# Patient Record
Sex: Female | Born: 1980 | Race: White | Hispanic: No | Marital: Married | Smoking: Never smoker
Health system: Southern US, Community
[De-identification: ages and names within clinical notes are randomized; demographics above are authoritative.]

## PROBLEM LIST (undated history)

## (undated) DIAGNOSIS — K8020 Calculus of gallbladder without cholecystitis without obstruction: Secondary | ICD-10-CM

## (undated) DIAGNOSIS — Z8489 Family history of other specified conditions: Secondary | ICD-10-CM

## (undated) DIAGNOSIS — T8859XA Other complications of anesthesia, initial encounter: Secondary | ICD-10-CM

## (undated) DIAGNOSIS — Z8619 Personal history of other infectious and parasitic diseases: Secondary | ICD-10-CM

## (undated) DIAGNOSIS — T4145XA Adverse effect of unspecified anesthetic, initial encounter: Secondary | ICD-10-CM

## (undated) DIAGNOSIS — G610 Guillain-Barre syndrome: Secondary | ICD-10-CM

## (undated) HISTORY — PX: WISDOM TOOTH EXTRACTION: SHX21

---
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
## 2000-02-21 ENCOUNTER — Encounter: Payer: Self-pay | Admitting: Emergency Medicine

## 2000-02-21 ENCOUNTER — Emergency Department (HOSPITAL_COMMUNITY): Admission: EM | Admit: 2000-02-21 | Discharge: 2000-02-21 | Payer: Self-pay | Admitting: Emergency Medicine

## 2000-05-06 ENCOUNTER — Other Ambulatory Visit: Admission: RE | Admit: 2000-05-06 | Discharge: 2000-05-06 | Payer: Self-pay | Admitting: *Deleted

## 2001-07-23 ENCOUNTER — Other Ambulatory Visit: Admission: RE | Admit: 2001-07-23 | Discharge: 2001-07-23 | Payer: Self-pay | Admitting: Obstetrics and Gynecology

## 2005-07-10 ENCOUNTER — Other Ambulatory Visit: Admission: RE | Admit: 2005-07-10 | Discharge: 2005-07-10 | Payer: Self-pay | Admitting: Gynecology

## 2006-02-13 ENCOUNTER — Other Ambulatory Visit: Admission: RE | Admit: 2006-02-13 | Discharge: 2006-02-13 | Payer: Self-pay | Admitting: Gynecology

## 2006-07-15 ENCOUNTER — Other Ambulatory Visit: Admission: RE | Admit: 2006-07-15 | Discharge: 2006-07-15 | Payer: Self-pay | Admitting: Gynecology

## 2006-10-17 ENCOUNTER — Other Ambulatory Visit: Admission: RE | Admit: 2006-10-17 | Discharge: 2006-10-17 | Payer: Self-pay | Admitting: Gynecology

## 2007-05-16 ENCOUNTER — Emergency Department (HOSPITAL_COMMUNITY): Admission: EM | Admit: 2007-05-16 | Discharge: 2007-05-16 | Payer: Self-pay | Admitting: Emergency Medicine

## 2007-06-04 ENCOUNTER — Other Ambulatory Visit: Admission: RE | Admit: 2007-06-04 | Discharge: 2007-06-04 | Payer: Self-pay | Admitting: Gynecology

## 2007-12-09 ENCOUNTER — Other Ambulatory Visit: Admission: RE | Admit: 2007-12-09 | Discharge: 2007-12-09 | Payer: Self-pay | Admitting: Gynecology

## 2008-07-29 ENCOUNTER — Other Ambulatory Visit: Admission: RE | Admit: 2008-07-29 | Discharge: 2008-07-29 | Payer: Self-pay | Admitting: Gynecology

## 2011-04-25 LAB — URINALYSIS, ROUTINE W REFLEX MICROSCOPIC
Bilirubin Urine: NEGATIVE
Glucose, UA: NEGATIVE
Ketones, ur: NEGATIVE
Nitrite: NEGATIVE
Protein, ur: NEGATIVE
Specific Gravity, Urine: 1.015
Urobilinogen, UA: 0.2
pH: 6

## 2011-04-25 LAB — I-STAT 8, (EC8 V) (CONVERTED LAB)
Chloride: 108
Glucose, Bld: 119 — ABNORMAL HIGH
Potassium: 3.9
pH, Ven: 7.47 — ABNORMAL HIGH

## 2011-04-25 LAB — POCT I-STAT CREATININE
Creatinine, Ser: 0.9
Operator id: 282201

## 2011-04-25 LAB — URINE MICROSCOPIC-ADD ON

## 2011-04-25 LAB — ETHANOL: Alcohol, Ethyl (B): 244 — ABNORMAL HIGH

## 2011-04-25 LAB — PREGNANCY, URINE: Preg Test, Ur: NEGATIVE

## 2017-07-16 DIAGNOSIS — K8020 Calculus of gallbladder without cholecystitis without obstruction: Secondary | ICD-10-CM

## 2017-07-16 HISTORY — DX: Calculus of gallbladder without cholecystitis without obstruction: K80.20

## 2017-07-20 ENCOUNTER — Emergency Department (HOSPITAL_COMMUNITY): Payer: 59

## 2017-07-20 ENCOUNTER — Emergency Department (HOSPITAL_COMMUNITY)
Admission: EM | Admit: 2017-07-20 | Discharge: 2017-07-20 | Disposition: A | Discharge status: Home / Self Care | Payer: 59 | Attending: Emergency Medicine | Admitting: Emergency Medicine

## 2017-07-20 ENCOUNTER — Encounter (HOSPITAL_COMMUNITY): Payer: Self-pay | Admitting: *Deleted

## 2017-07-20 ENCOUNTER — Other Ambulatory Visit: Payer: Self-pay

## 2017-07-20 DIAGNOSIS — Z79899 Other long term (current) drug therapy: Secondary | ICD-10-CM | POA: Diagnosis not present

## 2017-07-20 DIAGNOSIS — K8020 Calculus of gallbladder without cholecystitis without obstruction: Secondary | ICD-10-CM | POA: Insufficient documentation

## 2017-07-20 DIAGNOSIS — R1013 Epigastric pain: Secondary | ICD-10-CM | POA: Diagnosis present

## 2017-07-20 DIAGNOSIS — R109 Unspecified abdominal pain: Secondary | ICD-10-CM

## 2017-07-20 LAB — CBC
HEMATOCRIT: 40 % (ref 36.0–46.0)
HEMOGLOBIN: 13.2 g/dL (ref 12.0–15.0)
MCH: 28.3 pg (ref 26.0–34.0)
MCHC: 33 g/dL (ref 30.0–36.0)
MCV: 85.7 fL (ref 78.0–100.0)
Platelets: 341 10*3/uL (ref 150–400)
RBC: 4.67 MIL/uL (ref 3.87–5.11)
RDW: 12.8 % (ref 11.5–15.5)
WBC: 10.2 10*3/uL (ref 4.0–10.5)

## 2017-07-20 LAB — URINALYSIS, ROUTINE W REFLEX MICROSCOPIC
BILIRUBIN URINE: NEGATIVE
Glucose, UA: NEGATIVE mg/dL
Hgb urine dipstick: NEGATIVE
Ketones, ur: NEGATIVE mg/dL
LEUKOCYTES UA: NEGATIVE
NITRITE: NEGATIVE
Protein, ur: NEGATIVE mg/dL
SPECIFIC GRAVITY, URINE: 1.015 (ref 1.005–1.030)
pH: 7 (ref 5.0–8.0)

## 2017-07-20 LAB — COMPREHENSIVE METABOLIC PANEL
ALK PHOS: 78 U/L (ref 38–126)
ALT: 25 U/L (ref 14–54)
ANION GAP: 11 (ref 5–15)
AST: 21 U/L (ref 15–41)
Albumin: 4.4 g/dL (ref 3.5–5.0)
BILIRUBIN TOTAL: 0.5 mg/dL (ref 0.3–1.2)
BUN: 12 mg/dL (ref 6–20)
CALCIUM: 9.3 mg/dL (ref 8.9–10.3)
CO2: 25 mmol/L (ref 22–32)
Chloride: 101 mmol/L (ref 101–111)
Creatinine, Ser: 0.75 mg/dL (ref 0.44–1.00)
GFR calc non Af Amer: >60 mL/min (ref >60)
Glucose, Bld: 104 mg/dL — ABNORMAL HIGH (ref 65–99)
POTASSIUM: 4.2 mmol/L (ref 3.5–5.1)
SODIUM: 137 mmol/L (ref 135–145)
TOTAL PROTEIN: 7.3 g/dL (ref 6.5–8.1)

## 2017-07-20 LAB — LIPASE, BLOOD: Lipase: 38 U/L (ref 11–51)

## 2017-07-20 LAB — I-STAT BETA HCG BLOOD, ED (MC, WL, AP ONLY)

## 2017-07-20 MED ORDER — GI COCKTAIL ~~LOC~~
30.0000 mL | Freq: Once | ORAL | Status: AC
Start: 2017-07-20 — End: 2017-07-20
  Administered 2017-07-20: 30 mL via ORAL
  Filled 2017-07-20: qty 30

## 2017-07-20 MED ORDER — HYDROCODONE-ACETAMINOPHEN 5-325 MG PO TABS: 1.0000 | ORAL_TABLET | Freq: Four times a day (QID) | ORAL | 0 refills | Status: DC | PRN

## 2017-07-20 MED ORDER — ONDANSETRON 4 MG PO TBDP: 4.0000 mg | ORAL_TABLET | Freq: Three times a day (TID) | ORAL | 0 refills | Status: AC | PRN

## 2017-07-20 MED ORDER — ALUM & MAG HYDROXIDE-SIMETH 200-200-20 MG/5ML PO SUSP: 15.0000 mL | Freq: Four times a day (QID) | ORAL | 0 refills | Status: AC | PRN

## 2017-07-20 NOTE — Discharge Instructions (Signed)
Continue to have care with your diet. Use Zofran as needed for nausea or vomiting. Use Tylenol as needed for mild to moderate pain. Take Norco as needed for severe pain. You may use Maalox as needed for a feeling of acid reflux. Follow-up with the surgeon whose information is below for further evaluation and management of your abdominal pain and gallstones. Return to the emergency room if you develop high fevers, persistent vomiting, persistent pain, or any new or concerning symptoms.

## 2017-07-20 NOTE — ED Provider Notes (Signed)
[facility] EMERGENCY DEPARTMENT Provider Note   CSN: [id number] Arrival date & time: 07/20/17  0736     History   Chief Complaint Chief Complaint  Patient presents with  . Abdominal Pain  . Emesis    HPI Brittany Stanley is a 37 y.o. female presenting for abdominal pain.  Patient states that for the past 3 weeks, she has had intermittent abdominal pain.  Abdominal pain begins at night and last for several hours.  It is often relieved by vomiting or diarrhea.  She reports history of similar in the past several years, lasting for 1-2 weeks and then resolving without intervention.  She has been diagnosed with GERD, but no other abdominal conditions.  No history of abdominal surgeries.  She denies pain or nausea currently.  She denies fevers, chills, cough, chest pain, shortness of breath, urinary symptoms.  She denies blood in her stool.  She has tried Pepcid without improvement of symptoms.  She does not have a GI doctor or primary care.   HPI  History reviewed. No pertinent past medical history.  There are no active problems to display for this patient.   History reviewed. No pertinent surgical history.  OB History    No data available       Home Medications    Prior to Admission medications   Medication Sig Start Date End Date Taking? Authorizing Provider  acetaminophen (TYLENOL) 325 MG tablet Take 650 mg by mouth every 6 (six) hours as needed for mild pain or headache.   Yes [provider]  APPLE CIDER VINEGAR PO Take 1 capsule by mouth daily.   Yes [provider]  COLLAGEN PO Take 3 tablets by mouth daily.   Yes [provider]  ibuprofen (ADVIL,MOTRIN) 200 MG tablet Take 400 mg by mouth every 6 (six) hours as needed for fever, headache or mild pain.   Yes [provider]  omeprazole (PRILOSEC) 20 MG capsule Take 20 mg by mouth daily.   Yes [provider]  Probiotic Product (PROBIOTIC PO) Take 1 capsule  by mouth daily.   Yes [provider]  alum & mag hydroxide-simeth (MAALOX REGULAR STRENGTH) 200-200-20 MG/5ML suspension Take 15 mLs by mouth every 6 (six) hours as needed for indigestion or heartburn. 07/20/17   Onika Gudiel, PA-C  HYDROcodone-acetaminophen (NORCO/VICODIN) 5-325 MG tablet Take 1 tablet by mouth every 6 (six) hours as needed for severe pain. 07/20/17   Madex Seals, PA-C  ondansetron (ZOFRAN ODT) 4 MG disintegrating tablet Take 1 tablet (4 mg total) by mouth every 8 (eight) hours as needed for nausea or vomiting. 07/20/17   Cindy Brindisi, PA-C    Family History History reviewed. No pertinent family history.  Social History Social History   Tobacco Use  . Smoking status: Never Smoker  Substance Use Topics  . Alcohol use: Yes    Frequency: Never    Comment: occ  . Drug use: No     Allergies   Patient has no known allergies.   Review of Systems Review of Systems  Gastrointestinal: Positive for abdominal pain, diarrhea and nausea.  All other systems reviewed and are negative.    Physical Exam Updated Vital Signs BP 117/72   Pulse 67   Temp 98.4 F (36.9 C) (Oral)   Resp 19   Ht 4\' 10"  (1.473 m)   Wt 54.4 kg (120 lb)   LMP 07/11/2017   SpO2 96%   BMI 25.08 kg/m   Physical Exam  Constitutional: She is oriented to person, place, and time. She appears well-developed and well-nourished. No distress.  HENT:  Head: Normocephalic and atraumatic.  Eyes: EOM are normal.  Neck: Normal range of motion.  Cardiovascular: Normal rate, regular rhythm and intact distal pulses.  Pulmonary/Chest: Effort normal and breath sounds normal. No respiratory distress. She has no wheezes.  Abdominal: Soft. Bowel sounds are normal. She exhibits no distension and no mass. There is tenderness. There is positive Murphy's sign. There is no rigidity, no rebound, no guarding, no CVA tenderness and no tenderness at McBurney's point.  TTP of epigastric, RUQ and  suprapubic abd. No TTP at RLQ or L side. No rigidity, guarding or distention. Positive murphy's sign.   Musculoskeletal: Normal range of motion.  Neurological: She is alert and oriented to person, place, and time.  Skin: Skin is warm and dry.  Psychiatric: She has a normal mood and affect.  Nursing note and vitals reviewed.    ED Treatments / Results  Labs (all labs ordered are listed, but only abnormal results are displayed) Labs Reviewed  COMPREHENSIVE METABOLIC PANEL - Abnormal; Notable for the following components:      Result Value   Glucose, Bld 104 (*)    All other components within normal limits  URINALYSIS, ROUTINE W REFLEX MICROSCOPIC - Abnormal; Notable for the following components:   APPearance CLOUDY (*)    All other components within normal limits  LIPASE, BLOOD  CBC  I-STAT BETA HCG BLOOD, ED ([facility], [facility], [facility] ONLY)    EKG  EKG Interpretation None       Radiology US Abdomen Limited Ruq  Result Date: 07/20/2017 CLINICAL DATA:  Abdominal pain since last night EXAM: ULTRASOUND ABDOMEN LIMITED RIGHT UPPER QUADRANT COMPARISON:  CT abdomen and pelvis 03/11/2009 FINDINGS: Gallbladder: Dependent calculus in gallbladder 19 mm diameter. Small mild gallbladder sludge. Question small polyp versus adherent stone at the gallbladder fundus 8 mm diameter. No gallbladder wall thickening, pericholecystic fluid or sonographic Murphy sign. Common bile duct: Diameter: 2 mm diameter , normal Liver: Normal parenchymal echogenicity. No focal mass lesion. Portal vein is patent on color Doppler imaging with normal direction of blood flow towards the liver. No free fluid IMPRESSION: Cholelithiasis. Adherent stone versus 8 mm polyp back gallbladder fundus ; yearly follow-up ultrasound of gallbladder polyps 7-9 mm is recommended to demonstrate stability and exclude malignancy. This recommendation follows ACR consensus guidelines: White Paper of the ACR Incidental Findings Committee II on Gallbladder  and Biliary Findings. J Am Coll Radiol 2013:;10:953-956. Electronically Signed   By: Ulyses Southward M.D.   On: 07/20/2017 13:12    Procedures Procedures (including critical care time)  Medications Ordered in ED Medications  gi cocktail (Maalox,Lidocaine,Donnatal) (30 mLs Oral Given 07/20/17 1156)     Initial Impression / Assessment and Plan / ED Course  I have reviewed the triage vital signs and the nursing notes.  Pertinent labs & imaging results that were available during my care of the patient were reviewed by me and considered in my medical decision making (see chart for details).     Pt presenting for evaluation of abdominal pain for several weeks.  Pain begins at night and last for several hours before improving.  Physical exam shows positive Murphy sign with tenderness of epigastric and left upper quadrant.  No rigidity, guarding, or distention.  Vital signs reassuring, she is afebrile and not tachycardic.  She appears nontoxic.  Labs reassuring, no leukocytosis, lipase normal, no change in bili or liver  enzymes.  UA without signs of infection.  Possible gallstones.  Will obtain right upper quadrant ultrasound and give GI cocktail for symptom relief.  On reassessment, patient had mild improvement with GI cocktail.  Right upper quadrant ultrasound showed gallstones/polyp without gallbladder thickening or signs of cholecystitis.  Discussed findings with patient.  Discussed follow-up with surgery for further management of cholelithiasis.  Will give as needed Zofran, Norco, and Maalox.  At this time, patient appears safe for discharge.  Return precautions given.  Patient states she understands and agrees to plan.   Final Clinical Impressions(s) / ED Diagnoses   Final diagnoses:  Abdominal pain  Gallstones    ED Discharge Orders        Ordered    HYDROcodone-acetaminophen (NORCO/VICODIN) 5-325 MG tablet  Every 6 hours PRN     07/20/17 1328    ondansetron (ZOFRAN ODT) 4 MG  disintegrating tablet  Every 8 hours PRN     07/20/17 1328    alum & mag hydroxide-simeth (MAALOX REGULAR STRENGTH) 200-200-20 MG/5ML suspension  Every 6 hours PRN     07/20/17 1328       Sritha Chauncey, PA-C 07/20/17 2059    Shaune PollackIsaacs, Cameron, MD 07/21/17 [phone]

## 2017-07-20 NOTE — ED Triage Notes (Signed)
Pt reports several weeks of right side abd pain that radiates to mid abd and right side. Has n/v/d. reports similar episode in past that was related to acid reflux.

## 2017-07-24 ENCOUNTER — Encounter (HOSPITAL_COMMUNITY): Payer: Self-pay

## 2017-07-24 ENCOUNTER — Other Ambulatory Visit: Payer: Self-pay

## 2017-07-24 ENCOUNTER — Observation Stay (HOSPITAL_COMMUNITY)
Admission: EM | Admit: 2017-07-24 | Discharge: 2017-07-26 | Disposition: A | Discharge status: Home / Self Care | Payer: 59 | Attending: Surgery | Admitting: Surgery

## 2017-07-24 DIAGNOSIS — K810 Acute cholecystitis: Secondary | ICD-10-CM

## 2017-07-24 DIAGNOSIS — Z79899 Other long term (current) drug therapy: Secondary | ICD-10-CM | POA: Diagnosis not present

## 2017-07-24 DIAGNOSIS — Z8669 Personal history of other diseases of the nervous system and sense organs: Secondary | ICD-10-CM | POA: Insufficient documentation

## 2017-07-24 DIAGNOSIS — K8010 Calculus of gallbladder with chronic cholecystitis without obstruction: Secondary | ICD-10-CM | POA: Diagnosis not present

## 2017-07-24 DIAGNOSIS — K8020 Calculus of gallbladder without cholecystitis without obstruction: Secondary | ICD-10-CM | POA: Diagnosis present

## 2017-07-24 HISTORY — DX: Other complications of anesthesia, initial encounter: T88.59XA

## 2017-07-24 HISTORY — DX: Guillain-Barre syndrome: G61.0

## 2017-07-24 HISTORY — DX: Adverse effect of unspecified anesthetic, initial encounter: T41.45XA

## 2017-07-24 HISTORY — DX: Calculus of gallbladder without cholecystitis without obstruction: K80.20

## 2017-07-24 HISTORY — DX: Personal history of other infectious and parasitic diseases: Z86.19

## 2017-07-24 HISTORY — DX: Family history of other specified conditions: Z84.89

## 2017-07-24 LAB — CBC
HCT: 39.8 % (ref 36.0–46.0)
HEMATOCRIT: 34.4 % — AB (ref 36.0–46.0)
HEMOGLOBIN: 11.2 g/dL — AB (ref 12.0–15.0)
Hemoglobin: 13.4 g/dL (ref 12.0–15.0)
MCH: 28.5 pg (ref 26.0–34.0)
MCH: 27.9 pg (ref 26.0–34.0)
MCHC: 33.7 g/dL (ref 30.0–36.0)
MCHC: 32.6 g/dL (ref 30.0–36.0)
MCV: 84.5 fL (ref 78.0–100.0)
MCV: 85.8 fL (ref 78.0–100.0)
PLATELETS: 298 10*3/uL (ref 150–400)
Platelets: 359 10*3/uL (ref 150–400)
RBC: 4.71 MIL/uL (ref 3.87–5.11)
RBC: 4.01 MIL/uL (ref 3.87–5.11)
RDW: 12.4 % (ref 11.5–15.5)
RDW: 12.7 % (ref 11.5–15.5)
WBC: 9.8 10*3/uL (ref 4.0–10.5)
WBC: 11 10*3/uL — AB (ref 4.0–10.5)

## 2017-07-24 LAB — URINALYSIS, ROUTINE W REFLEX MICROSCOPIC
Bacteria, UA: NONE SEEN
Bilirubin Urine: NEGATIVE
Glucose, UA: NEGATIVE mg/dL
Hgb urine dipstick: NEGATIVE
Ketones, ur: 20 mg/dL — AB
Leukocytes, UA: NEGATIVE
Nitrite: NEGATIVE
Protein, ur: 30 mg/dL — AB
RBC / HPF: NONE SEEN RBC/hpf (ref 0–5)
Specific Gravity, Urine: 1.015 (ref 1.005–1.030)
Squamous Epithelial / LPF: NONE SEEN
WBC, UA: NONE SEEN WBC/hpf (ref 0–5)
pH: 9 — ABNORMAL HIGH (ref 5.0–8.0)

## 2017-07-24 LAB — COMPREHENSIVE METABOLIC PANEL
ALT: 23 U/L (ref 14–54)
AST: 18 U/L (ref 15–41)
Albumin: 4.5 g/dL (ref 3.5–5.0)
Alkaline Phosphatase: 80 U/L (ref 38–126)
Anion gap: 11 (ref 5–15)
BUN: 15 mg/dL (ref 6–20)
CO2: 23 mmol/L (ref 22–32)
Calcium: 9.6 mg/dL (ref 8.9–10.3)
Chloride: 102 mmol/L (ref 101–111)
Creatinine, Ser: 0.86 mg/dL (ref 0.44–1.00)
GFR calc Af Amer: >60 mL/min (ref >60)
GFR calc non Af Amer: >60 mL/min (ref >60)
Glucose, Bld: 110 mg/dL — ABNORMAL HIGH (ref 65–99)
Potassium: 3.8 mmol/L (ref 3.5–5.1)
Sodium: 136 mmol/L (ref 135–145)
Total Bilirubin: 0.9 mg/dL (ref 0.3–1.2)
Total Protein: 7.5 g/dL (ref 6.5–8.1)

## 2017-07-24 LAB — CREATININE, SERUM
Creatinine, Ser: 0.72 mg/dL (ref 0.44–1.00)
GFR calc non Af Amer: >60 mL/min (ref >60)

## 2017-07-24 LAB — I-STAT BETA HCG BLOOD, ED (MC, WL, AP ONLY): I-stat hCG, quantitative: <5 m[IU]/mL (ref <5)

## 2017-07-24 LAB — SURGICAL PCR SCREEN
MRSA, PCR: NEGATIVE
STAPHYLOCOCCUS AUREUS: NEGATIVE

## 2017-07-24 LAB — LIPASE, BLOOD: Lipase: 38 U/L (ref 11–51)

## 2017-07-24 MED ORDER — DIPHENHYDRAMINE HCL 25 MG PO CAPS
25.0000 mg | ORAL_CAPSULE | Freq: Four times a day (QID) | ORAL | Status: DC | PRN
  Administered 2017-07-24: 25 mg via ORAL
  Filled 2017-07-24: qty 1

## 2017-07-24 MED ORDER — ENOXAPARIN SODIUM 40 MG/0.4ML ~~LOC~~ SOLN
40.0000 mg | SUBCUTANEOUS | Status: AC
  Administered 2017-07-24: 40 mg via SUBCUTANEOUS
  Filled 2017-07-24: qty 0.4

## 2017-07-24 MED ORDER — DEXTROSE 5 % IV SOLN
2.0000 g | INTRAVENOUS | Status: DC
  Administered 2017-07-24: 2 g via INTRAVENOUS
  Filled 2017-07-24 (×3): qty 2

## 2017-07-24 MED ORDER — ONDANSETRON HCL 4 MG/2ML IJ SOLN
4.0000 mg | Freq: Once | INTRAMUSCULAR | Status: AC
  Administered 2017-07-24: 4 mg via INTRAVENOUS
  Filled 2017-07-24: qty 2

## 2017-07-24 MED ORDER — SODIUM CHLORIDE 0.45 % IV SOLN
INTRAVENOUS | Status: DC
  Administered 2017-07-24 – 2017-07-25 (×2): via INTRAVENOUS

## 2017-07-24 MED ORDER — ONDANSETRON 4 MG PO TBDP: 4.0000 mg | ORAL_TABLET | Freq: Four times a day (QID) | ORAL | Status: DC | PRN

## 2017-07-24 MED ORDER — PANTOPRAZOLE SODIUM 40 MG IV SOLR
40.0000 mg | Freq: Every day | INTRAVENOUS | Status: DC
  Administered 2017-07-24: 40 mg via INTRAVENOUS
  Filled 2017-07-24: qty 40

## 2017-07-24 MED ORDER — DIPHENHYDRAMINE HCL 50 MG/ML IJ SOLN: 25.0000 mg | Freq: Four times a day (QID) | INTRAMUSCULAR | Status: DC | PRN

## 2017-07-24 MED ORDER — ACETAMINOPHEN 325 MG PO TABS: 650.0000 mg | ORAL_TABLET | Freq: Four times a day (QID) | ORAL | Status: DC | PRN

## 2017-07-24 MED ORDER — ACETAMINOPHEN 650 MG RE SUPP: 650.0000 mg | Freq: Four times a day (QID) | RECTAL | Status: DC | PRN

## 2017-07-24 MED ORDER — OXYCODONE HCL 5 MG PO TABS: 5.0000 mg | ORAL_TABLET | ORAL | Status: DC | PRN

## 2017-07-24 MED ORDER — MORPHINE SULFATE (PF) 4 MG/ML IV SOLN
4.0000 mg | Freq: Once | INTRAVENOUS | Status: AC
  Administered 2017-07-24: 4 mg via INTRAVENOUS
  Filled 2017-07-24: qty 1

## 2017-07-24 MED ORDER — ONDANSETRON HCL 4 MG/2ML IJ SOLN: 4.0000 mg | Freq: Four times a day (QID) | INTRAMUSCULAR | Status: DC | PRN

## 2017-07-24 MED ORDER — FENTANYL CITRATE (PF) 100 MCG/2ML IJ SOLN
50.0000 ug | INTRAMUSCULAR | Status: AC | PRN
  Administered 2017-07-24 – 2017-07-25 (×2): 50 ug via NASAL
  Filled 2017-07-24: qty 2

## 2017-07-24 MED ORDER — SODIUM CHLORIDE 0.9 % IV BOLUS (SEPSIS)
1000.0000 mL | Freq: Once | INTRAVENOUS | Status: AC
  Administered 2017-07-24: 1000 mL via INTRAVENOUS

## 2017-07-24 MED ORDER — SIMETHICONE 80 MG PO CHEW: 40.0000 mg | CHEWABLE_TABLET | Freq: Four times a day (QID) | ORAL | Status: DC | PRN

## 2017-07-24 MED ORDER — MORPHINE SULFATE (PF) 4 MG/ML IV SOLN: 1.0000 mg | INTRAVENOUS | Status: DC | PRN

## 2017-07-24 NOTE — Discharge Instructions (Signed)

## 2017-07-24 NOTE — ED Triage Notes (Signed)
Pt has known gallstones, scheduled appt with surgeon on Monday, pain returned today with vomiting, diarrhea. Vomiting in triage. .Marland Kitchen

## 2017-07-24 NOTE — H&P (Signed)
[facility] Admission Note  Brittany Stanley September 11, 1980  [id number].    Requesting MD: Virgel Manifold Chief Complaint/Reason for Consult: symptomatic cholelithiasis  HPI:  Brittany Stanley is a 37yo female who presented to [facility] today with persistent and worsening right sided abdominal pain, nausea, vomiting, diarrhea, and chills. She is here today with her mother. States that she has had intermittent pain for 1.5 years, and her symptoms have become more frequent over the last 3 weeks. States that she has had 2 severe attacks since Saturday. Pain is RUQ and radiates into her back. it is constant and severe. Vicodin helps a little. She was seen in the ED 1/5 and given outpatient follow up with Dr. Brantley Stage 1/14. Unfortunately patient was in too much pain to wait for the appointment and came to the ED today.  U/s from 1/5 shows cholelithiasis without signs of cholecystitis. Today WBC, LFTs, lipase WNL. General surgery asked to see for symptomatic cholelithiasis. Last meal was small amount of chicken last night.  PMH significant for H/o Guillain Barre syndrome age 24 Abdominal surgical history: none Anticoagulants: none Nonsmoker Employment: works at a [employer]  ROS: Review of Systems  Constitutional: Positive for chills and malaise/fatigue.  HENT: Negative.   Eyes: Negative.   Respiratory: Negative.   Cardiovascular: Negative.   Gastrointestinal: Positive for abdominal pain, diarrhea, nausea and vomiting. Negative for blood in stool, constipation and melena.  Genitourinary: Negative.   Musculoskeletal: Negative.   Skin: Negative.   Neurological: Negative.    All systems reviewed and otherwise negative except for as above  No family history on file.  History reviewed. No pertinent past medical history.  History reviewed. No pertinent surgical history.  Social History:  reports that  has never smoked. she has never used smokeless tobacco. She reports that she drinks  alcohol. She reports that she does not use drugs.  Allergies: No Known Allergies   (Not in a hospital admission)  Prior to Admission medications   Medication Sig Start Date End Date Taking? Authorizing Provider  acetaminophen (TYLENOL) 325 MG tablet Take 650 mg by mouth every 6 (six) hours as needed for mild pain or headache.    [provider]  alum & mag hydroxide-simeth (MAALOX REGULAR STRENGTH) 200-200-20 MG/5ML suspension Take 15 mLs by mouth every 6 (six) hours as needed for indigestion or heartburn. 07/20/17   Caccavale, Sophia, PA-C  APPLE CIDER VINEGAR PO Take 1 capsule by mouth daily.    [provider]  COLLAGEN PO Take 3 tablets by mouth daily.    [provider]  HYDROcodone-acetaminophen (NORCO/VICODIN) 5-325 MG tablet Take 1 tablet by mouth every 6 (six) hours as needed for severe pain. 07/20/17   Caccavale, Sophia, PA-C  ibuprofen (ADVIL,MOTRIN) 200 MG tablet Take 400 mg by mouth every 6 (six) hours as needed for fever, headache or mild pain.    [provider]  omeprazole (PRILOSEC) 20 MG capsule Take 20 mg by mouth daily.    [provider]  ondansetron (ZOFRAN ODT) 4 MG disintegrating tablet Take 1 tablet (4 mg total) by mouth every 8 (eight) hours as needed for nausea or vomiting. 07/20/17   Caccavale, Sophia, PA-C  Probiotic Product (PROBIOTIC PO) Take 1 capsule by mouth daily.    [provider]    Blood pressure 117/74, pulse 96, temperature 98.6 F (37 C), temperature source Oral, resp. rate 17, last menstrual period 07/11/2017, SpO2 97 %. Physical Exam: General: pleasant, WD/WN white female who is laying  in bed in NAD but appears uncomfortable HEENT: head is normocephalic, atraumatic.  Sclera are noninjected.  Pupils equal and round.  Ears and nose without any masses or lesions.  Mouth is pink and moist. Dentition fair Heart: regular, rate, and rhythm.  No obvious murmurs, gallops, or rubs noted.  Palpable pedal pulses  bilaterally Lungs: CTAB, no wheezes, rhonchi, or rales noted.  Respiratory effort nonlabored Abd: soft, ND, +BS, no masses, hernias, or organomegaly. Mild global tenderness with most significant TTP RUQ and epigastric region MS: all 4 extremities are symmetrical with no cyanosis, clubbing, or edema. Skin: warm and dry with no masses, lesions, or rashes Psych: A&Ox3 with an appropriate affect. Neuro: cranial nerves grossly intact, extremity CSM intact bilaterally, normal speech  Results for orders placed or performed during the hospital encounter of 07/24/17 (from the past 48 hour(s))  Lipase, blood     Status: None   Collection Time: 07/24/17  7:29 AM  Result Value Ref Range   Lipase 38 11 - 51 U/L  Comprehensive metabolic panel     Status: Abnormal   Collection Time: 07/24/17  7:29 AM  Result Value Ref Range   Sodium 136 135 - 145 mmol/L   Potassium 3.8 3.5 - 5.1 mmol/L   Chloride 102 101 - 111 mmol/L   CO2 23 22 - 32 mmol/L   Glucose, Bld 110 (H) 65 - 99 mg/dL   BUN 15 6 - 20 mg/dL   Creatinine, Ser 0.86 0.44 - 1.00 mg/dL   Calcium 9.6 8.9 - 10.3 mg/dL   Total Protein 7.5 6.5 - 8.1 g/dL   Albumin 4.5 3.5 - 5.0 g/dL   AST 18 15 - 41 U/L   ALT 23 14 - 54 U/L   Alkaline Phosphatase 80 38 - 126 U/L   Total Bilirubin 0.9 0.3 - 1.2 mg/dL   GFR calc non Af Amer >60 >60 mL/min   GFR calc Af Amer >60 >60 mL/min    Comment: (NOTE) The eGFR has been calculated using the CKD EPI equation. This calculation has not been validated in all clinical situations. eGFR's persistently <60 mL/min signify possible Chronic Kidney Disease.    Anion gap 11 5 - 15  CBC     Status: None   Collection Time: 07/24/17  7:29 AM  Result Value Ref Range   WBC 9.8 4.0 - 10.5 K/uL   RBC 4.71 3.87 - 5.11 MIL/uL   Hemoglobin 13.4 12.0 - 15.0 g/dL   HCT 39.8 36.0 - 46.0 %   MCV 84.5 78.0 - 100.0 fL   MCH 28.5 26.0 - 34.0 pg   MCHC 33.7 30.0 - 36.0 g/dL   RDW 12.4 11.5 - 15.5 %   Platelets 359 150 - 400  K/uL  Urinalysis, Routine w reflex microscopic     Status: Abnormal   Collection Time: 07/24/17  7:34 AM  Result Value Ref Range   Color, Urine YELLOW YELLOW   APPearance CLOUDY (A) CLEAR   Specific Gravity, Urine 1.015 1.005 - 1.030   pH 9.0 (H) 5.0 - 8.0   Glucose, UA NEGATIVE NEGATIVE mg/dL   Hgb urine dipstick NEGATIVE NEGATIVE   Bilirubin Urine NEGATIVE NEGATIVE   Ketones, ur 20 (A) NEGATIVE mg/dL   Protein, ur 30 (A) NEGATIVE mg/dL   Nitrite NEGATIVE NEGATIVE   Leukocytes, UA NEGATIVE NEGATIVE   RBC / HPF NONE SEEN 0 - 5 RBC/hpf   WBC, UA NONE SEEN 0 - 5 WBC/hpf   Bacteria, UA NONE  SEEN NONE SEEN   Squamous Epithelial / LPF NONE SEEN NONE SEEN   Amorphous Crystal PRESENT   I-Stat beta hCG blood, ED     Status: None   Collection Time: 07/24/17  7:34 AM  Result Value Ref Range   I-stat hCG, quantitative <5.0 <5 mIU/mL   Comment 3            Comment:   GEST. AGE      CONC.  (mIU/mL)   <=1 WEEK        5 - 50     2 WEEKS       50 - 500     3 WEEKS       100 - 10,000     4 WEEKS     1,000 - 30,000        FEMALE AND NON-PREGNANT FEMALE:     LESS THAN 5 mIU/mL    No results found.    Assessment/Plan H/o Guillain Barre syndrome age 27  Symptomatic cholelithiasis - no prior abdominal surgery - intermittent symptoms for 1.5 years, worse x3 weeks - u/s (1/5) shows Cholelithiasis without signs of cholecystitis - WBC, LFTs, lipase WNL  ID - rocephin FEN - IVF, clear liquids, NPO after MN VTE - SCDs, lovenox Foley - none Follow up - TBD  Plan - Admit patient to med-surg. Start IV rocephin, IVF, antiemetics. [place] for clear liquids today, NPO after MN. Plan for cholecystectomy tomorrow.  Wellington Hampshire, [facility] Surgery 07/24/2017, 1:45 PM Pager: [phone] Consults: [phone] Mon-Fri 7:00 am-4:30 pm Sat-Sun 7:00 am-11:30 am

## 2017-07-24 NOTE — ED Notes (Signed)
Surgeon at bedside.  

## 2017-07-24 NOTE — ED Provider Notes (Signed)
[facility] Provider Note   CSN: [id number] Arrival date & time: 07/24/17  [id number]     History   Chief Complaint Chief Complaint  Patient presents with  . Abdominal Pain    HPI Brittany Stanley is a 37 y.o. female.  HPI   37 year old female with abdominal pain.  She has had a waxing and for several years.  Previously diagnosed as GERD.  Her pain has been stronger in character and more frequent in the past month.  She was seen in the emergency room on January fifth and she had a right upper quadrant ultrasound.  This was significant for cholelithiasis/sludge.  [facility]  Since last seen in the ED she has had increasing pain despite taking Vicodin.  She continues to feel nauseated and have anorexia.  No fevers.  No urinary complaints.  No previous abdominal surgery.  History reviewed. No pertinent past medical history.  There are no active problems to display for this patient.   History reviewed. No pertinent surgical history.  OB History    No data available       Home Medications    Prior to Admission medications   Medication Sig Start Date End Date Taking? Authorizing Provider  acetaminophen (TYLENOL) 325 MG tablet Take 650 mg by mouth every 6 (six) hours as needed for mild pain or headache.    [provider]  alum & mag hydroxide-simeth (MAALOX REGULAR STRENGTH) 200-200-20 MG/5ML suspension Take 15 mLs by mouth every 6 (six) hours as needed for indigestion or heartburn. 07/20/17   Caccavale, Sophia, PA-C  APPLE CIDER VINEGAR PO Take 1 capsule by mouth daily.    [provider]  COLLAGEN PO Take 3 tablets by mouth daily.    [provider]  HYDROcodone-acetaminophen (NORCO/VICODIN) 5-325 MG tablet Take 1 tablet by mouth every 6 (six) hours as needed for severe pain. 07/20/17   Caccavale, Sophia, PA-C  ibuprofen (ADVIL,MOTRIN) 200 MG tablet Take  400 mg by mouth every 6 (six) hours as needed for fever, headache or mild pain.    [provider]  omeprazole (PRILOSEC) 20 MG capsule Take 20 mg by mouth daily.    [provider]  ondansetron (ZOFRAN ODT) 4 MG disintegrating tablet Take 1 tablet (4 mg total) by mouth every 8 (eight) hours as needed for nausea or vomiting. 07/20/17   Caccavale, Sophia, PA-C  Probiotic Product (PROBIOTIC PO) Take 1 capsule by mouth daily.    [provider]    Family History No family history on file.  Social History Social History   Tobacco Use  . Smoking status: Never Smoker  . Smokeless tobacco: Never Used  Substance Use Topics  . Alcohol use: Yes    Frequency: Never    Comment: occ  . Drug use: No     Allergies   Patient has no known allergies.   Review of Systems Review of Systems  All systems reviewed and negative, other than as noted in HPI.  Physical Exam Updated Vital Signs BP 117/74 (BP Location: Left Arm)   Pulse 96   Temp 98.6 F (37 C) (Oral)   Resp 17   LMP 07/11/2017   SpO2 97%   Physical Exam  Constitutional: She appears well-developed and well-nourished. No distress.  HENT:  Head: Normocephalic and atraumatic.  Eyes: Conjunctivae are normal. Right eye exhibits no discharge. Left eye exhibits no discharge.  Neck: Neck supple.  Cardiovascular: Normal rate, regular rhythm and normal heart sounds. Exam reveals no gallop and no friction rub.  No murmur heard. Pulmonary/Chest: Effort normal and breath sounds normal. No respiratory distress.  Abdominal: Soft. She exhibits no distension. There is tenderness.  Epigastric and right upper quadrant tenderness without rebound or guarding.  No distention.  Musculoskeletal: She exhibits no edema or tenderness.  Neurological: She is alert.  Skin: Skin is warm and dry.  Psychiatric: She has a normal mood and affect. Her behavior is normal. Thought content normal.  Nursing note and vitals  reviewed.    ED Treatments / Results  Labs (all labs ordered are listed, but only abnormal results are displayed) Labs Reviewed  COMPREHENSIVE METABOLIC PANEL - Abnormal; Notable for the following components:      Result Value   Glucose, Bld 110 (*)    All other components within normal limits  URINALYSIS, ROUTINE W REFLEX MICROSCOPIC - Abnormal; Notable for the following components:   APPearance CLOUDY (*)    pH 9.0 (*)    Ketones, ur 20 (*)    Protein, ur 30 (*)    All other components within normal limits  LIPASE, BLOOD  CBC  I-STAT BETA HCG BLOOD, ED ([facility], [facility], [facility] ONLY)    EKG  EKG Interpretation None       Radiology No results found.  Procedures Procedures (including critical care time)  Medications Ordered in ED Medications  fentaNYL (SUBLIMAZE) injection 50 mcg (50 mcg Nasal Given 07/24/17 0715)  morphine 4 MG/ML injection 4 mg (not administered)  ondansetron (ZOFRAN) injection 4 mg (not administered)  sodium chloride 0.9 % bolus 1,000 mL (not administered)     Initial Impression / Assessment and Plan / ED Course  I have reviewed the triage vital signs and the nursing notes.  Pertinent labs & imaging results that were available during my care of the patient were reviewed by me and considered in my medical decision making (see chart for details).     37 year old female with symptomatic cholelithiasis.  Symptoms not controlled with pain/nausea meds.  Will discuss with surgery.  Final Clinical Impressions(s) / ED Diagnoses   Final diagnoses:  Symptomatic cholelithiasis    ED Discharge Orders    None       Raeford Razor, MD 07/24/17 [phone]

## 2017-07-25 ENCOUNTER — Encounter (HOSPITAL_COMMUNITY)
Admission: EM | Disposition: A | Discharge status: Home / Self Care | Payer: Self-pay | Source: Home / Self Care | Attending: Emergency Medicine

## 2017-07-25 ENCOUNTER — Encounter (HOSPITAL_COMMUNITY): Payer: Self-pay | Admitting: *Deleted

## 2017-07-25 ENCOUNTER — Observation Stay (HOSPITAL_COMMUNITY): Payer: 59

## 2017-07-25 ENCOUNTER — Observation Stay (HOSPITAL_COMMUNITY): Payer: 59 | Admitting: Certified Registered Nurse Anesthetist

## 2017-07-25 HISTORY — PX: CHOLECYSTECTOMY: SHX55

## 2017-07-25 LAB — HIV ANTIBODY (ROUTINE TESTING W REFLEX): HIV Screen 4th Generation wRfx: NONREACTIVE

## 2017-07-25 SURGERY — LAPAROSCOPIC CHOLECYSTECTOMY WITH INTRAOPERATIVE CHOLANGIOGRAM
Anesthesia: General | Site: Abdomen

## 2017-07-25 MED ORDER — LIDOCAINE 2% (20 MG/ML) 5 ML SYRINGE
INTRAMUSCULAR | Status: AC
  Filled 2017-07-25: qty 5

## 2017-07-25 MED ORDER — MIDAZOLAM HCL 5 MG/5ML IJ SOLN
INTRAMUSCULAR | Status: DC | PRN
  Administered 2017-07-25: 2 mg via INTRAVENOUS

## 2017-07-25 MED ORDER — FENTANYL CITRATE (PF) 250 MCG/5ML IJ SOLN
INTRAMUSCULAR | Status: AC
  Filled 2017-07-25: qty 5

## 2017-07-25 MED ORDER — ONDANSETRON HCL 4 MG/2ML IJ SOLN
INTRAMUSCULAR | Status: AC
  Filled 2017-07-25: qty 2

## 2017-07-25 MED ORDER — DEXAMETHASONE SODIUM PHOSPHATE 10 MG/ML IJ SOLN
INTRAMUSCULAR | Status: DC | PRN
  Administered 2017-07-25: 10 mg via INTRAVENOUS

## 2017-07-25 MED ORDER — FENTANYL CITRATE (PF) 100 MCG/2ML IJ SOLN
INTRAMUSCULAR | Status: DC | PRN
  Administered 2017-07-25 (×4): 50 ug via INTRAVENOUS

## 2017-07-25 MED ORDER — PROMETHAZINE HCL 25 MG/ML IJ SOLN
6.2500 mg | INTRAMUSCULAR | Status: DC | PRN
  Administered 2017-07-25: 6.25 mg via INTRAVENOUS

## 2017-07-25 MED ORDER — LIDOCAINE 2% (20 MG/ML) 5 ML SYRINGE
INTRAMUSCULAR | Status: DC | PRN
  Administered 2017-07-25: 80 mg via INTRAVENOUS

## 2017-07-25 MED ORDER — DOCUSATE SODIUM 100 MG PO CAPS
100.0000 mg | ORAL_CAPSULE | Freq: Two times a day (BID) | ORAL | Status: DC
  Administered 2017-07-25 – 2017-07-26 (×2): 100 mg via ORAL
  Filled 2017-07-25 (×2): qty 1

## 2017-07-25 MED ORDER — OXYCODONE HCL 5 MG PO TABS
5.0000 mg | ORAL_TABLET | ORAL | Status: DC | PRN
  Administered 2017-07-25: 5 mg via ORAL
  Filled 2017-07-25: qty 1

## 2017-07-25 MED ORDER — ROCURONIUM BROMIDE 10 MG/ML (PF) SYRINGE
PREFILLED_SYRINGE | INTRAVENOUS | Status: DC | PRN
  Administered 2017-07-25: 30 mg via INTRAVENOUS
  Administered 2017-07-25: 5 mg via INTRAVENOUS

## 2017-07-25 MED ORDER — PROMETHAZINE HCL 25 MG/ML IJ SOLN
INTRAMUSCULAR | Status: AC
  Filled 2017-07-25: qty 1

## 2017-07-25 MED ORDER — MORPHINE SULFATE (PF) 4 MG/ML IV SOLN
2.0000 mg | INTRAVENOUS | Status: DC | PRN
  Administered 2017-07-25: 4 mg via INTRAVENOUS
  Filled 2017-07-25: qty 1

## 2017-07-25 MED ORDER — DIPHENHYDRAMINE HCL 50 MG/ML IJ SOLN: 25.0000 mg | Freq: Four times a day (QID) | INTRAMUSCULAR | Status: DC | PRN

## 2017-07-25 MED ORDER — BUPIVACAINE-EPINEPHRINE 0.25% -1:200000 IJ SOLN
INTRAMUSCULAR | Status: DC | PRN
  Administered 2017-07-25: 12 mL

## 2017-07-25 MED ORDER — SODIUM CHLORIDE 0.9 % IR SOLN
Status: DC | PRN
  Administered 2017-07-25: 1000 mL

## 2017-07-25 MED ORDER — SCOPOLAMINE 1 MG/3DAYS TD PT72
1.0000 | MEDICATED_PATCH | TRANSDERMAL | Status: DC
  Administered 2017-07-25: 1.5 mg via TRANSDERMAL
  Filled 2017-07-25: qty 1

## 2017-07-25 MED ORDER — ROCURONIUM BROMIDE 10 MG/ML (PF) SYRINGE
PREFILLED_SYRINGE | INTRAVENOUS | Status: AC
  Filled 2017-07-25: qty 5

## 2017-07-25 MED ORDER — ACETAMINOPHEN 650 MG RE SUPP: 650.0000 mg | Freq: Four times a day (QID) | RECTAL | Status: DC | PRN

## 2017-07-25 MED ORDER — DIPHENHYDRAMINE HCL 25 MG PO CAPS: 25.0000 mg | ORAL_CAPSULE | Freq: Four times a day (QID) | ORAL | Status: DC | PRN

## 2017-07-25 MED ORDER — ONDANSETRON HCL 4 MG/2ML IJ SOLN: 4.0000 mg | Freq: Four times a day (QID) | INTRAMUSCULAR | Status: DC | PRN

## 2017-07-25 MED ORDER — FENTANYL CITRATE (PF) 100 MCG/2ML IJ SOLN
INTRAMUSCULAR | Status: AC
  Administered 2017-07-25: 50 ug via INTRAVENOUS
  Filled 2017-07-25: qty 2

## 2017-07-25 MED ORDER — 0.9 % SODIUM CHLORIDE (POUR BTL) OPTIME
TOPICAL | Status: DC | PRN
  Administered 2017-07-25: 1000 mL

## 2017-07-25 MED ORDER — IOPAMIDOL (ISOVUE-300) INJECTION 61%
INTRAVENOUS | Status: AC
  Filled 2017-07-25: qty 50

## 2017-07-25 MED ORDER — DEXAMETHASONE SODIUM PHOSPHATE 10 MG/ML IJ SOLN
INTRAMUSCULAR | Status: AC
  Filled 2017-07-25: qty 1

## 2017-07-25 MED ORDER — SODIUM CHLORIDE 0.9 % IV SOLN
INTRAVENOUS | Status: DC
  Administered 2017-07-25 – 2017-07-26 (×2): via INTRAVENOUS

## 2017-07-25 MED ORDER — SODIUM CHLORIDE 0.9 % IV SOLN
INTRAVENOUS | Status: DC | PRN
  Administered 2017-07-25: 6 mL

## 2017-07-25 MED ORDER — MIDAZOLAM HCL 2 MG/2ML IJ SOLN
INTRAMUSCULAR | Status: AC
  Filled 2017-07-25: qty 2

## 2017-07-25 MED ORDER — ONDANSETRON 4 MG PO TBDP: 4.0000 mg | ORAL_TABLET | Freq: Four times a day (QID) | ORAL | Status: DC | PRN

## 2017-07-25 MED ORDER — FENTANYL CITRATE (PF) 100 MCG/2ML IJ SOLN
25.0000 ug | INTRAMUSCULAR | Status: DC | PRN
  Administered 2017-07-25 (×2): 50 ug via INTRAVENOUS

## 2017-07-25 MED ORDER — SUGAMMADEX SODIUM 200 MG/2ML IV SOLN
INTRAVENOUS | Status: DC | PRN
  Administered 2017-07-25: 150 mg via INTRAVENOUS

## 2017-07-25 MED ORDER — ONDANSETRON HCL 4 MG/2ML IJ SOLN
INTRAMUSCULAR | Status: DC | PRN
  Administered 2017-07-25: 4 mg via INTRAVENOUS

## 2017-07-25 MED ORDER — BUPIVACAINE-EPINEPHRINE 0.25% -1:200000 IJ SOLN
INTRAMUSCULAR | Status: AC
  Filled 2017-07-25: qty 1

## 2017-07-25 MED ORDER — ACETAMINOPHEN 325 MG PO TABS: 650.0000 mg | ORAL_TABLET | Freq: Four times a day (QID) | ORAL | Status: DC | PRN

## 2017-07-25 MED ORDER — PROPOFOL 10 MG/ML IV BOLUS
INTRAVENOUS | Status: DC | PRN
  Administered 2017-07-25: 30 mg via INTRAVENOUS
  Administered 2017-07-25: 100 mg via INTRAVENOUS

## 2017-07-25 MED ORDER — LACTATED RINGERS IV SOLN
INTRAVENOUS | Status: DC
  Administered 2017-07-25: 10:00:00 via INTRAVENOUS

## 2017-07-25 MED ORDER — PROPOFOL 10 MG/ML IV BOLUS
INTRAVENOUS | Status: AC
  Filled 2017-07-25: qty 20

## 2017-07-25 SURGICAL SUPPLY — 46 items
APL SKNCLS STERI-STRIP NONHPOA (GAUZE/BANDAGES/DRESSINGS) ×1
APPLIER CLIP ROT 10 11.4 M/L (STAPLE) ×3
APR CLP MED LRG 11.4X10 (STAPLE) ×1
BAG SPEC RTRVL LRG 6X4 10 (ENDOMECHANICALS) ×1
BENZOIN TINCTURE PRP APPL 2/3 (GAUZE/BANDAGES/DRESSINGS) ×3 IMPLANT
CANISTER SUCT 3000ML PPV (MISCELLANEOUS) ×3 IMPLANT
CHLORAPREP W/TINT 26ML (MISCELLANEOUS) ×3 IMPLANT
CLIP APPLIE ROT 10 11.4 M/L (STAPLE) ×1 IMPLANT
CLOSURE WOUND 1/2 X4 (GAUZE/BANDAGES/DRESSINGS) ×1
COVER MAYO STAND STRL (DRAPES) ×3 IMPLANT
COVER SURGICAL LIGHT HANDLE (MISCELLANEOUS) ×3 IMPLANT
DRAPE C-ARM 42X72 X-RAY (DRAPES) ×3 IMPLANT
DRSG TEGADERM 2-3/8X2-3/4 SM (GAUZE/BANDAGES/DRESSINGS) ×9 IMPLANT
DRSG TEGADERM 4X4.75 (GAUZE/BANDAGES/DRESSINGS) ×3 IMPLANT
ELECT CAUTERY BLADE 6.4 (BLADE) ×2 IMPLANT
ELECT REM PT RETURN 9FT ADLT (ELECTROSURGICAL) ×3
ELECTRODE REM PT RTRN 9FT ADLT (ELECTROSURGICAL) ×1 IMPLANT
FILTER SMOKE EVAC LAPAROSHD (FILTER) ×3 IMPLANT
GAUZE SPONGE 2X2 8PLY STRL LF (GAUZE/BANDAGES/DRESSINGS) ×1 IMPLANT
GLOVE BIO SURGEON STRL SZ7 (GLOVE) ×3 IMPLANT
GLOVE BIOGEL PI IND STRL 7.5 (GLOVE) ×1 IMPLANT
GLOVE BIOGEL PI INDICATOR 7.5 (GLOVE) ×2
GOWN STRL REUS W/ TWL LRG LVL3 (GOWN DISPOSABLE) ×3 IMPLANT
GOWN STRL REUS W/TWL LRG LVL3 (GOWN DISPOSABLE) ×9
KIT BASIN OR (CUSTOM PROCEDURE TRAY) ×3 IMPLANT
KIT ROOM TURNOVER OR (KITS) ×3 IMPLANT
NS IRRIG 1000ML POUR BTL (IV SOLUTION) ×3 IMPLANT
PAD ARMBOARD 7.5X6 YLW CONV (MISCELLANEOUS) ×3 IMPLANT
PENCIL BUTTON HOLSTER BLD 10FT (ELECTRODE) ×2 IMPLANT
POUCH SPECIMEN RETRIEVAL 10MM (ENDOMECHANICALS) ×3 IMPLANT
SCISSORS LAP 5X35 DISP (ENDOMECHANICALS) ×3 IMPLANT
SET CHOLANGIOGRAPH 5 50 .035 (SET/KITS/TRAYS/PACK) ×3 IMPLANT
SET IRRIG TUBING LAPAROSCOPIC (IRRIGATION / IRRIGATOR) ×3 IMPLANT
SLEEVE ENDOPATH XCEL 5M (ENDOMECHANICALS) ×3 IMPLANT
SPECIMEN JAR SMALL (MISCELLANEOUS) ×3 IMPLANT
SPONGE GAUZE 2X2 STER 10/PKG (GAUZE/BANDAGES/DRESSINGS) ×2
STRIP CLOSURE SKIN 1/2X4 (GAUZE/BANDAGES/DRESSINGS) ×2 IMPLANT
SUT MNCRL AB 4-0 PS2 18 (SUTURE) ×3 IMPLANT
SUT VICRYL 0 UR6 27IN ABS (SUTURE) ×2 IMPLANT
TOWEL OR 17X24 6PK STRL BLUE (TOWEL DISPOSABLE) ×3 IMPLANT
TOWEL OR 17X26 10 PK STRL BLUE (TOWEL DISPOSABLE) ×3 IMPLANT
TRAY LAPAROSCOPIC MC (CUSTOM PROCEDURE TRAY) ×3 IMPLANT
TROCAR XCEL BLUNT TIP 100MML (ENDOMECHANICALS) ×3 IMPLANT
TROCAR XCEL NON-BLD 11X100MML (ENDOMECHANICALS) ×3 IMPLANT
TROCAR XCEL NON-BLD 5MMX100MML (ENDOMECHANICALS) ×3 IMPLANT
TUBING INSUFFLATION (TUBING) ×3 IMPLANT

## 2017-07-25 NOTE — Anesthesia Postprocedure Evaluation (Signed)
Anesthesia Post Note  Patient: Brittany Stanley  Procedure(s) Performed: LAPAROSCOPIC CHOLECYSTECTOMY WITH INTRAOPERATIVE CHOLANGIOGRAM (N/A Abdomen)     Patient location during evaluation: PACU Anesthesia Type: General Level of consciousness: sedated Pain management: pain level controlled Vital Signs Assessment: post-procedure vital signs reviewed and stable Respiratory status: spontaneous breathing and respiratory function stable Cardiovascular status: stable Anesthetic complications: no Comments: Pt experienced nausea post-op    Last Vitals:  Vitals:   07/25/17 1345 07/25/17 1400  BP: (!) 144/89 (!) 148/90  Pulse: 61 73  Resp: 16   Temp:  37.2 C  SpO2: 98% 100%    Last Pain:  Vitals:   07/25/17 1510  TempSrc:   PainSc: 7                  Cheresa Siers DANIEL

## 2017-07-25 NOTE — Anesthesia Procedure Notes (Signed)
Procedure Name: Intubation Date/Time: 07/25/2017 10:54 AM Performed by: Colin Benton, CRNA Pre-anesthesia Checklist: Patient identified, Emergency Drugs available, Suction available and Patient being monitored Patient Re-evaluated:Patient Re-evaluated prior to induction Oxygen Delivery Method: Circle system utilized Preoxygenation: Pre-oxygenation with 100% oxygen Induction Type: IV induction Ventilation: Mask ventilation without difficulty Laryngoscope Size: Mac and 3 Grade View: Grade II Tube type: Oral Tube size: 7.0 mm Number of attempts: 1 Airway Equipment and Method: Stylet Placement Confirmation: ETT inserted through vocal cords under direct vision,  positive ETCO2 and breath sounds checked- equal and bilateral Secured at: 22 cm Tube secured with: Tape Dental Injury: Teeth and Oropharynx as per pre-operative assessment

## 2017-07-25 NOTE — Transfer of Care (Signed)
Immediate Anesthesia Transfer of Care Note  Patient: Brittany Stanley  Procedure(s) Performed: LAPAROSCOPIC CHOLECYSTECTOMY WITH INTRAOPERATIVE CHOLANGIOGRAM (N/A Abdomen)  Patient Location: PACU  Anesthesia Type:General  Level of Consciousness: awake, alert , oriented and patient cooperative  Airway & Oxygen Therapy: Patient Spontanous Breathing and Patient connected to nasal cannula oxygen  Post-op Assessment: Report given to RN, Post -op Vital signs reviewed and stable and Patient moving all extremities X 4  Post vital signs: Reviewed and stable  Last Vitals:  Vitals:   07/24/17 2209 07/25/17 0449  BP: 129/84 117/81  Pulse: 70 72  Resp: 17 18  Temp: 36.7 C 36.9 C  SpO2: 97% 100%    Last Pain:  Vitals:   07/25/17 0449  TempSrc: Oral  PainSc:          Complications: No apparent anesthesia complications

## 2017-07-25 NOTE — Op Note (Signed)
Laparoscopic Cholecystectomy with IOC Procedure Note  Indications: This patient presents with symptomatic gallbladder disease and will undergo laparoscopic cholecystectomy.  Pre-operative Diagnosis: Calculus of gallbladder with acute cholecystitis, without mention of obstruction  Post-operative Diagnosis: Same  Surgeon: Wynona Luna   Assistants: none  Anesthesia: General endotracheal anesthesia  ASA Class: 1  Procedure Details  The patient was seen again in the Holding Room. The risks, benefits, complications, treatment options, and expected outcomes were discussed with the patient. The possibilities of reaction to medication, pulmonary aspiration, perforation of viscus, bleeding, recurrent infection, finding a normal gallbladder, the need for additional procedures, failure to diagnose a condition, the possible need to convert to an open procedure, and creating a complication requiring transfusion or operation were discussed with the patient. The likelihood of improving the patient's symptoms with return to their baseline status is good.  The patient and/or family concurred with the proposed plan, giving informed consent. The site of surgery properly noted. The patient was taken to Operating Room, identified as [facility] Subia and the procedure verified as Laparoscopic Cholecystectomy with Intraoperative Cholangiogram. A Time Out was held and the above information confirmed.  Prior to the induction of general anesthesia, antibiotic prophylaxis was administered. General endotracheal anesthesia was then administered and tolerated well. After the induction, the abdomen was prepped with Chloraprep and draped in the sterile fashion. The patient was positioned in the supine position.  Local anesthetic agent was injected into the skin below the umbilicus and an incision made. We dissected down to the abdominal fascia with blunt dissection.  The fascia was incised vertically and we entered the  peritoneal cavity bluntly.  A pursestring suture of 0-Vicryl was placed around the fascial opening.  The Hasson cannula was inserted and secured with the stay suture.  Pneumoperitoneum was then created with CO2 and tolerated well without any adverse changes in the patient's vital signs. An 11-mm port was placed in the subxiphoid position.  Two 5-mm ports were placed in the right upper quadrant. All skin incisions were infiltrated with a local anesthetic agent before making the incision and placing the trocars.   We positioned the patient in reverse Trendelenburg, tilted slightly to the patient's left.  The gallbladder was identified, the fundus grasped and retracted cephalad.  There were some adhesions to the gallbladder, and the wall of the gallbladder was mildly thickened.  The gallbladder was quite distended and there is an obvious large stone in the proximal gallbladder. Adhesions were lysed bluntly and with the electrocautery where indicated, taking care not to injure any adjacent organs or viscus. The infundibulum was grasped and retracted laterally, exposing the peritoneum overlying the triangle of Calot. This was then divided and exposed in a blunt fashion. A critical view of the cystic duct and cystic artery was obtained.  The cystic duct was clearly identified and bluntly dissected circumferentially. The cystic duct was ligated with a clip distally.   An incision was made in the cystic duct and the [facility] cholangiogram catheter introduced. The catheter was secured using a clip. A cholangiogram was then obtained which showed good visualization of the distal and proximal biliary tree with no sign of filling defects or obstruction.  Contrast flowed easily into the duodenum. The catheter was then removed.   The cystic duct was then ligated with clips and divided. The cystic artery was identified, dissected free, ligated with clips and divided as well.   The gallbladder was dissected from the liver bed in  retrograde fashion with the  electrocautery. The gallbladder was removed and placed in an Endocatch sac. The liver bed was irrigated and inspected. Hemostasis was achieved with the electrocautery. Copious irrigation was utilized and was repeatedly aspirated until clear.  The gallbladder and Endocatch sac were then removed through the umbilical port site.  We had to enlarge the fascial opening because of the very large stone.  A small amount of bile was spilled on the incision.  We irrigated the umbilical wound thoroughly.  0 Vicryl was used to close the fascia.  We again inspected the right upper quadrant for hemostasis.  Pneumoperitoneum was released as we removed the trocars.  4-0 Monocryl was used to close the skin.   Benzoin, steri-strips, and clean dressings were applied. The patient was then extubated and brought to the recovery room in stable condition. Instrument, sponge, and needle counts were correct at closure and at the conclusion of the case.   Findings: Cholecystitis with Cholelithiasis  Estimated Blood Loss: Minimal         Drains: none         Specimens: Gallbladder           Complications: None; patient tolerated the procedure well.         Disposition: PACU - hemodynamically stable.         Condition: stable  Wilmon ArmsMatthew K. Corliss Skainssuei, MD, [facility]  General/ Trauma Surgery  07/25/2017 12:16 PM

## 2017-07-25 NOTE — Anesthesia Preprocedure Evaluation (Addendum)
Anesthesia Evaluation  Patient identified by MRN, date of birth, ID band Patient awake    Reviewed: Allergy & Precautions, NPO status , Patient's Chart, lab work & pertinent test results  History of Anesthesia Complications Negative for: history of anesthetic complications  Airway Mallampati: II  TM Distance: >3 FB Neck ROM: Full    Dental no notable dental hx. (+) Dental Advisory Given   Pulmonary neg pulmonary ROS,    Pulmonary exam normal        Cardiovascular negative cardio ROS Normal cardiovascular exam     Neuro/Psych Hx of Guillain-Barre at age 89 negative psych ROS   GI/Hepatic Neg liver ROS,   Endo/Other  negative endocrine ROS  Renal/GU negative Renal ROS  negative genitourinary   Musculoskeletal negative musculoskeletal ROS (+)   Abdominal   Peds negative pediatric ROS (+)  Hematology negative hematology ROS (+)   Anesthesia Other Findings   Reproductive/Obstetrics negative OB ROS                            Anesthesia Physical Anesthesia Plan  ASA: II  Anesthesia Plan: General   Post-op Pain Management:    Induction: Intravenous  PONV Risk Score and Plan: 3 and Ondansetron, Dexamethasone and Scopolamine patch - Pre-op  Airway Management Planned: Oral ETT  Additional Equipment:   Intra-op Plan:   Post-operative Plan: Extubation in OR  Informed Consent: I have reviewed the patients History and Physical, chart, labs and discussed the procedure including the risks, benefits and alternatives for the proposed anesthesia with the patient or authorized representative who has indicated his/her understanding and acceptance.   Dental advisory given  Plan Discussed with: CRNA, Anesthesiologist and Surgeon  Anesthesia Plan Comments:         Anesthesia Quick Evaluation

## 2017-07-25 NOTE — Progress Notes (Signed)
[id number] Patient taken down to OR for lap chole.

## 2017-07-25 NOTE — Progress Notes (Signed)
   Subjective/Chief Complaint: Patient not requiring much pain meds - some mild RUQ tenderness   Objective: Vital signs in last 24 hours: Temp:  [98 F (36.7 C)-98.9 F (37.2 C)] 98.4 F (36.9 C) (01/10 0449) Pulse Rate:  [67-99] 72 (01/10 0449) Resp:  [17-18] 18 (01/10 0449) BP: (109-129)/(61-93) 117/81 (01/10 0449) SpO2:  [97 %-100 %] 100 % (01/10 0449) Weight:  [54.6 kg (120 lb 5.9 oz)] 54.6 kg (120 lb 5.9 oz) (01/09 1655) Last BM Date: 07/24/17  Intake/Output from previous day: 01/09 0701 - 01/10 0700 In: 1613.3 [P.O.:360; I.V.:1203.3; IV Piggyback:50] Out: -  Intake/Output this shift: No intake/output data recorded.  General appearance: alert, cooperative and no distress GI: soft, mild RUQ tenderness  Lab Results:  Recent Labs    07/24/17 0729 07/24/17 1718  WBC 9.8 11.0*  HGB 13.4 11.2*  HCT 39.8 34.4*  PLT 359 298   BMET Recent Labs    07/24/17 0729 07/24/17 1718  NA 136  --   K 3.8  --   CL 102  --   CO2 23  --   GLUCOSE 110*  --   BUN 15  --   CREATININE 0.86 0.72  CALCIUM 9.6  --    PT/INR No results for input(s): LABPROT, INR in the last 72 hours. ABG No results for input(s): PHART, HCO3 in the last 72 hours.  Invalid input(s): PCO2, PO2  Studies/Results: No results found.  Anti-infectives: Anti-infectives (From admission, onward)   Start     Dose/Rate Route Frequency Ordered Stop   07/24/17 1430  cefTRIAXone (ROCEPHIN) 2 g in dextrose 5 % 50 mL IVPB     2 g 100 mL/hr over 30 Minutes Intravenous Every 24 hours 07/24/17 1403        Assessment/Plan: Acute calculus cholecystitis - plan lap chole with IOC today.  Wilmon ArmsMatthew K. Corliss Skainssuei, MD, [facility]  General/ Trauma Surgery  07/25/2017 7:53 AM   LOS: 0 days    Wynona LunaMatthew K Dennys Traughber 07/25/2017

## 2017-07-26 ENCOUNTER — Encounter (HOSPITAL_COMMUNITY): Payer: Self-pay | Admitting: Surgery

## 2017-07-26 MED ORDER — MORPHINE SULFATE (PF) 4 MG/ML IV SOLN: 2.0000 mg | INTRAVENOUS | Status: DC | PRN

## 2017-07-26 MED ORDER — IBUPROFEN 600 MG PO TABS
600.0000 mg | ORAL_TABLET | Freq: Once | ORAL | Status: AC
  Administered 2017-07-26: 600 mg via ORAL
  Filled 2017-07-26: qty 1

## 2017-07-26 MED ORDER — OXYCODONE HCL 5 MG PO TABS: 5.0000 mg | ORAL_TABLET | Freq: Four times a day (QID) | ORAL | 0 refills | Status: AC | PRN

## 2017-07-26 MED ORDER — OXYCODONE HCL 5 MG PO TABS
5.0000 mg | ORAL_TABLET | ORAL | Status: DC | PRN
  Administered 2017-07-26: 5 mg via ORAL
  Filled 2017-07-26: qty 1

## 2017-07-26 NOTE — Progress Notes (Signed)
Pt discharged home in stable condition after going over discharge instructions with no concerns voiced. AVS and discharge script given before leaving unit 

## 2017-07-26 NOTE — Discharge Summary (Signed)
[facility] Discharge Summary   Patient ID: Brittany Stanley MRN: [id number] DOB/AGE: 37/03/1981 37 y.o.  Admit date: 07/24/2017 Discharge date: 07/26/2017  Admitting Diagnosis: Symptomatic cholelithiasis  Discharge Diagnosis Patient Active Problem List   Diagnosis Date Noted  . Symptomatic cholelithiasis 07/24/2017    Consultants None  Imaging: Dg Cholangiogram Operative  Result Date: 07/25/2017 CLINICAL DATA:  Laparoscopic cholecystectomy, acute cholecystitis EXAM: INTRAOPERATIVE CHOLANGIOGRAM TECHNIQUE: Cholangiographic images from the C-arm fluoroscopic device were submitted for interpretation post-operatively. Please see the procedural report for the amount of contrast and the fluoroscopy time utilized. COMPARISON:  Ultrasound 07/20/2017 FINDINGS: No persistent filling defects in the common duct. Intrahepatic ducts are incompletely visualized, appearing decompressed centrally. Contrast passes into the duodenum. : Negative for retained common duct stone. Electronically Signed   By: Corlis Leak  Hassell M.D.   On: 07/25/2017 11:55    Procedures Dr. Corliss Skainssuei (07/25/16) - Laparoscopic Cholecystectomy   Hospital Course:  Brittany Stanley is a 37yo female who [facility] worsening RUQ pain, nausea, and vomiting. She had been seen in the ED a few days prior and found to have symptomatic cholelithiasis; she was scheduled for outpatient followup with general surgery. Due to worsening pain, she returned to the ED.  Workup showed symptomatic cholelithiasis.  Patient was admitted and underwent procedure listed above.  Tolerated procedure well and was transferred to the floor.  Diet was advanced as tolerated.  On POD1, the patient was voiding well, tolerating diet, ambulating well, pain well controlled, vital signs stable, incisions c/d/i and felt stable for discharge home.  Patient will follow up in our office in 2 weeks and knows to call with questions or concerns.  I have  personally reviewed the patients medication history on the [place] controlled substance database.    Physical Exam: General:  Alert, NAD, pleasant, comfortable Pulm: effort normal Abd:  Soft, ND, appropriately tender, +BS, multiple lap incisions with C/D/I dressings Skin: warm and dry  Allergies as of 07/26/2017   No Known Allergies     Medication List    STOP taking these medications   HYDROcodone-acetaminophen 5-325 MG tablet Commonly known as:  NORCO/VICODIN     TAKE these medications   acetaminophen 325 MG tablet Commonly known as:  TYLENOL Take 650 mg by mouth every 6 (six) hours as needed for mild pain or headache.   alum & mag hydroxide-simeth 200-200-20 MG/5ML suspension Commonly known as:  MAALOX REGULAR STRENGTH Take 15 mLs by mouth every 6 (six) hours as needed for indigestion or heartburn.   APPLE CIDER VINEGAR PO Take 1 capsule by mouth daily.   BLISOVI 24 FE 1-20 MG-MCG(24) tablet Generic drug:  Norethindrone Acetate-Ethinyl Estrad-FE Take 1 tablet by mouth daily.   COLLAGEN PO Take 3 tablets by mouth daily.   ibuprofen 200 MG tablet Commonly known as:  ADVIL,MOTRIN Take 400 mg by mouth every 6 (six) hours as needed for fever, headache or mild pain.   omeprazole 20 MG capsule Commonly known as:  PRILOSEC Take 20 mg by mouth daily.   ondansetron 4 MG disintegrating tablet Commonly known as:  ZOFRAN ODT Take 1 tablet (4 mg total) by mouth every 8 (eight) hours as needed for nausea or vomiting.   oxyCODONE 5 MG immediate release tablet Commonly known as:  Oxy IR/ROXICODONE Take 1 tablet (5 mg total) by mouth every 6 (six) hours as needed for severe pain.   PROBIOTIC PO Take 1 capsule by mouth daily.  Follow-up Information    [facility], [place]. Go on 08/06/2017.   Specialty:  General Surgery Why:  Your appointment is 08/06/17 at 10:15 am. Please arrive 30 minutes prior to your appointment to check in and fill out paperwork. Bring  photo ID and insurance information. Contact information: [address] [place] [address] [phone]          Signed: Franne Forts, [facility] 07/26/2017, 8:10 AM Pager: [phone] Consults: [phone] Mon-Fri 7:00 am-4:30 pm Sat-Sun 7:00 am-11:30 am

## 2018-11-20 IMAGING — RF DG CHOLANGIOGRAM OPERATIVE
1 series · 5 of 5 positions shown · non-contrast
Comparison: Ultrasound 07/20/2017

CLINICAL DATA: Laparoscopic cholecystectomy, acute cholecystitis

EXAM:
INTRAOPERATIVE CHOLANGIOGRAM
TECHNIQUE: Cholangiographic images from the C-arm fluoroscopic device were
submitted for interpretation post-operatively. Please see the
procedural report for the amount of contrast and the fluoroscopy
time utilized.

[Series 1: run · 2 acquisitions, 5 frames shown]
[im 1/2]
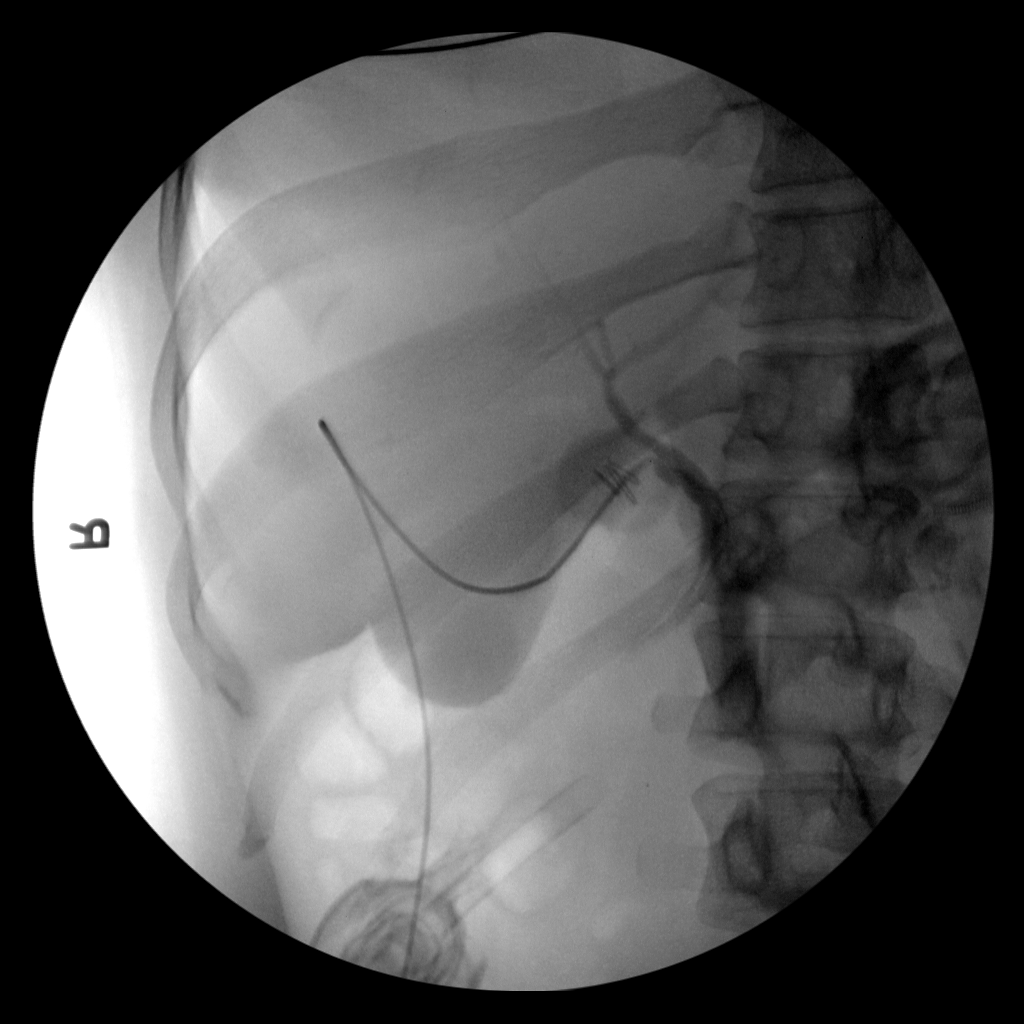
[im 1/2]
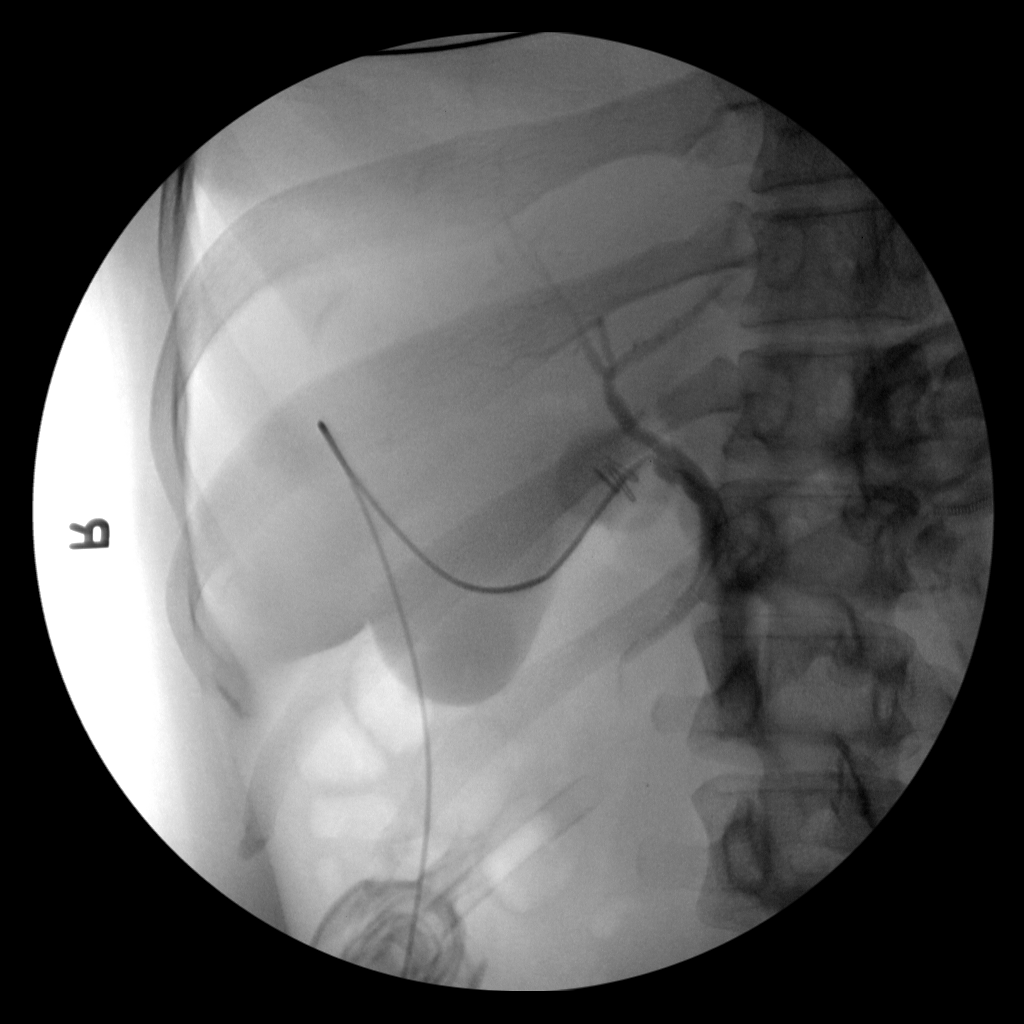
[im 1/2]
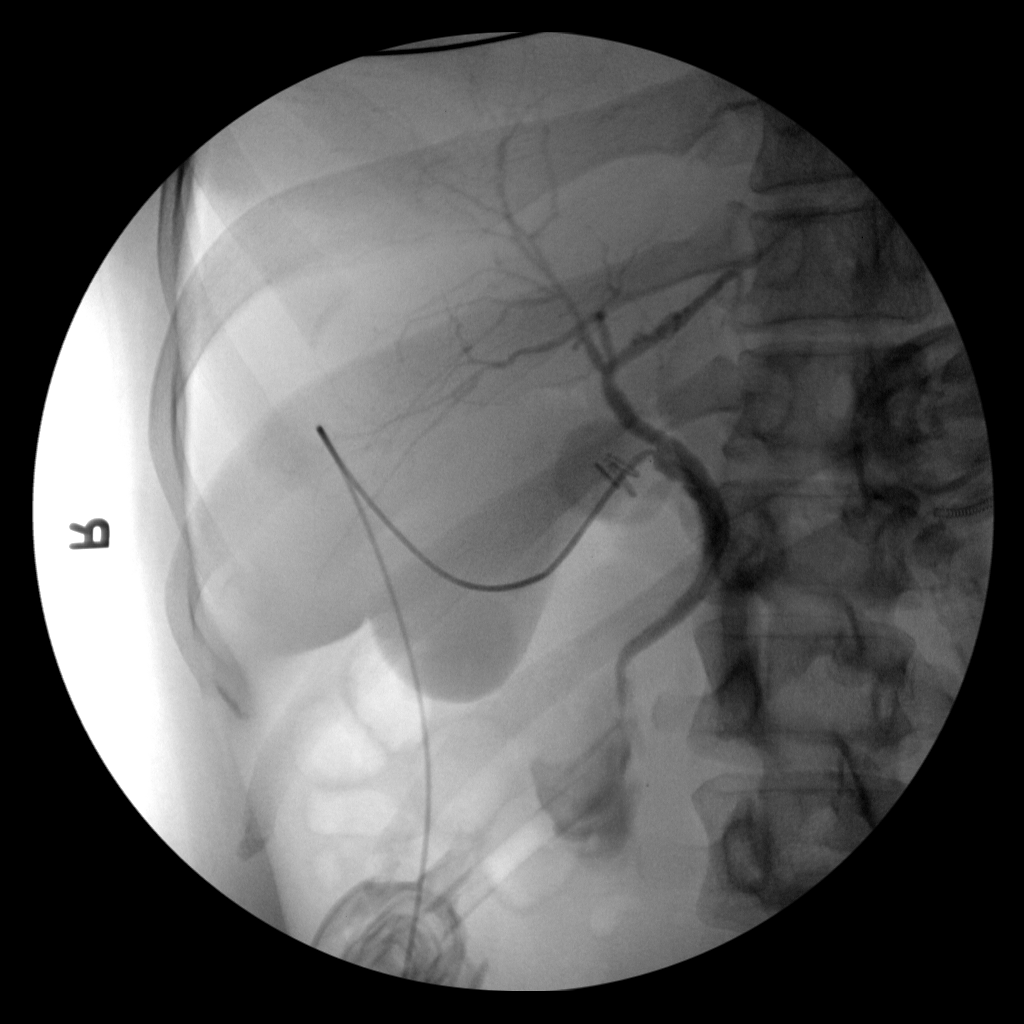
[im 1/2]
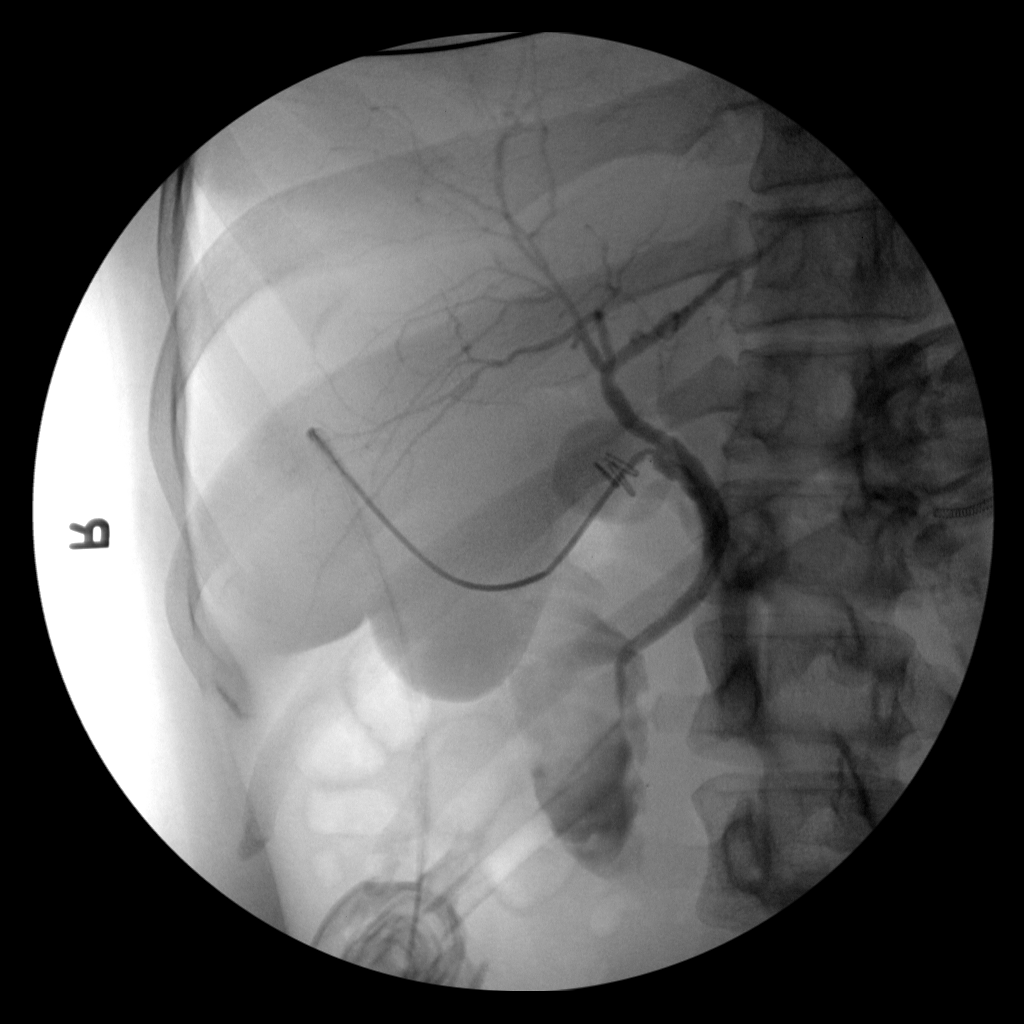
[im 2/2]
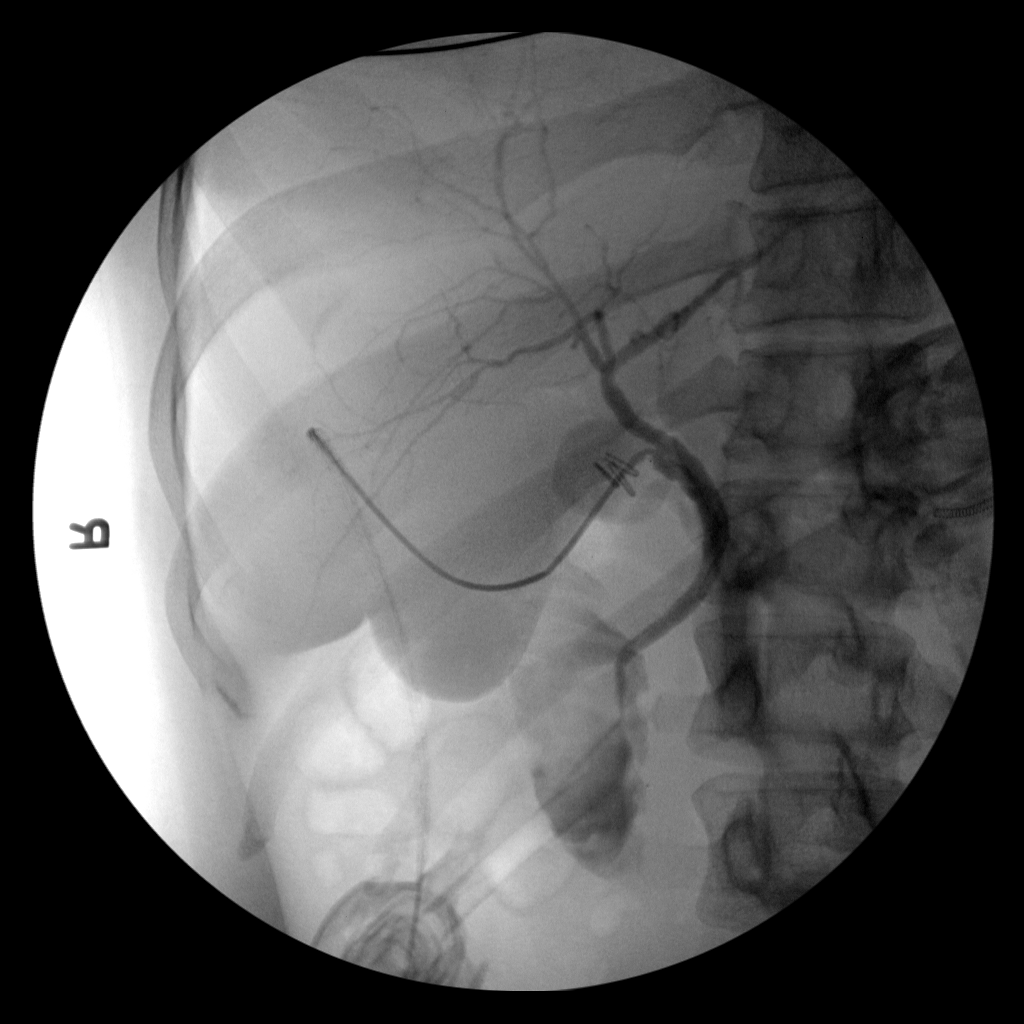

[5 of 5 positions shown; findings below may reference images not displayed]

FINDINGS: No persistent filling defects in the common duct. Intrahepatic ducts
are incompletely visualized, appearing decompressed centrally.
Contrast passes into the duodenum.

:
Negative for retained common duct stone.

## 2019-07-28 IMAGING — US US ABDOMEN LIMITED
1 series · 14 of 25 positions shown · non-contrast
Comparison: CT abdomen and pelvis 03/11/2009

CLINICAL DATA: Abdominal pain since last night

EXAM:
ULTRASOUND ABDOMEN LIMITED RIGHT UPPER QUADRANT

[Series 1: us abdomen limited · 0.20mm/px · 14 of 34 slices shown]
[im 1/34]
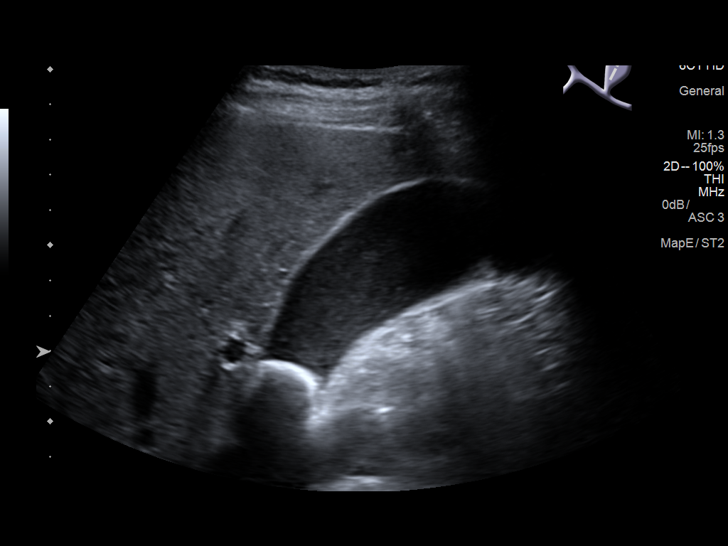
[im 3/34]
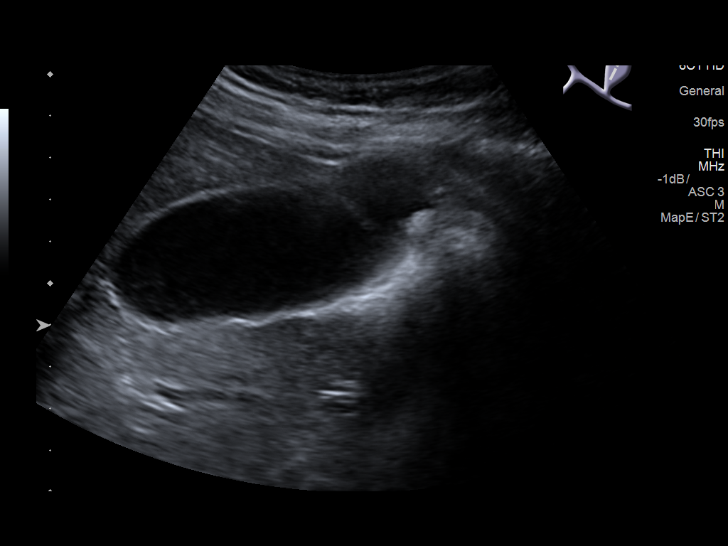
[im 6/34]
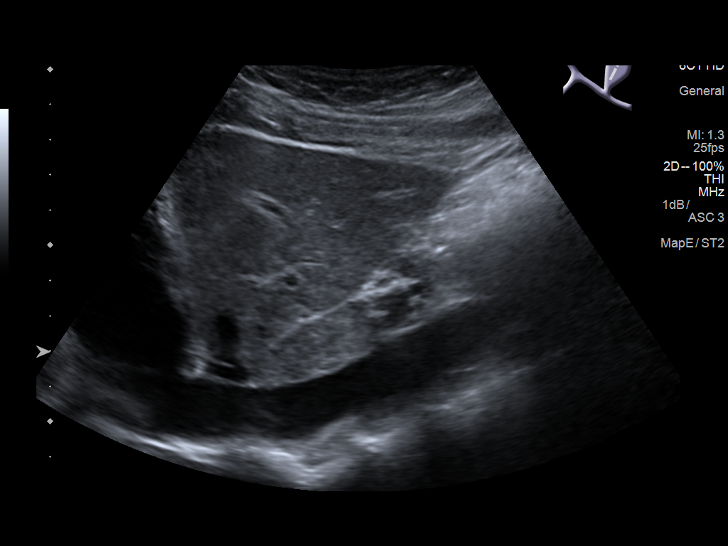
[im 9/34]
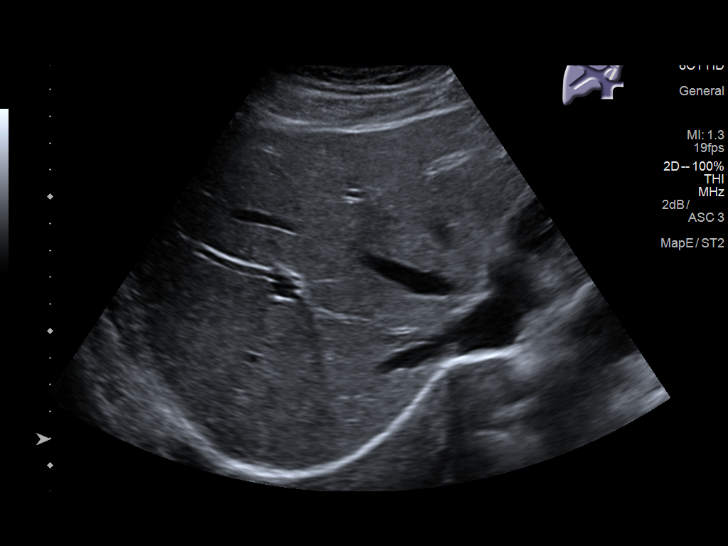
[im 12/34]
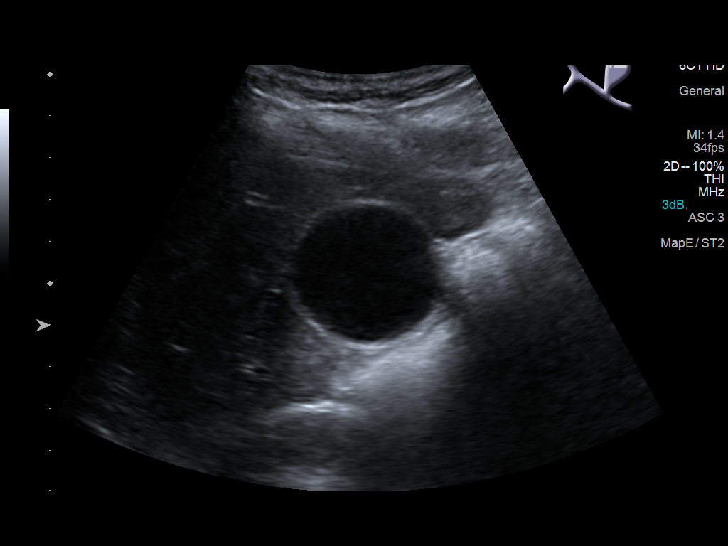
[im 13/34]
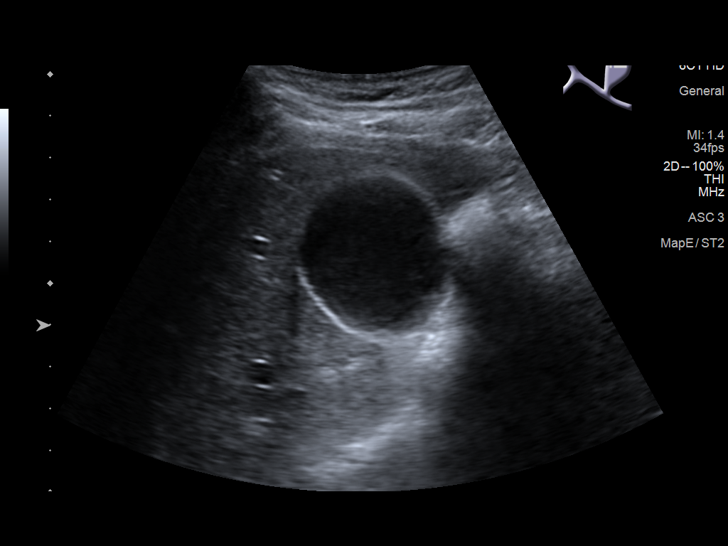
[im 16/34]
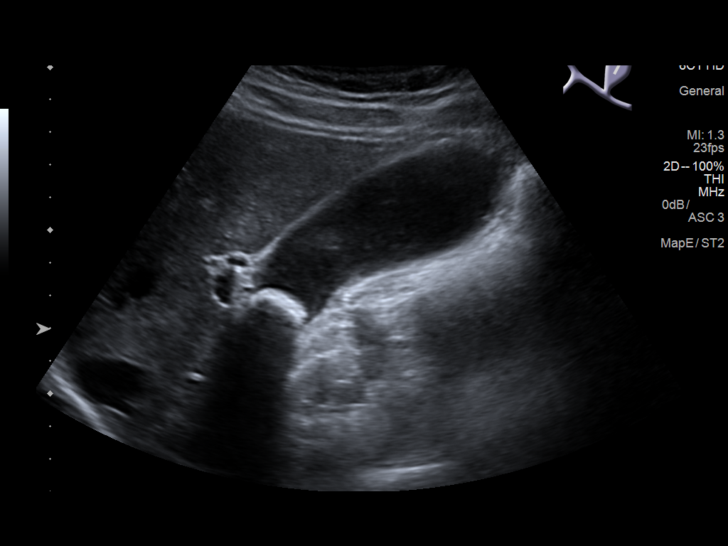
[im 18/34]
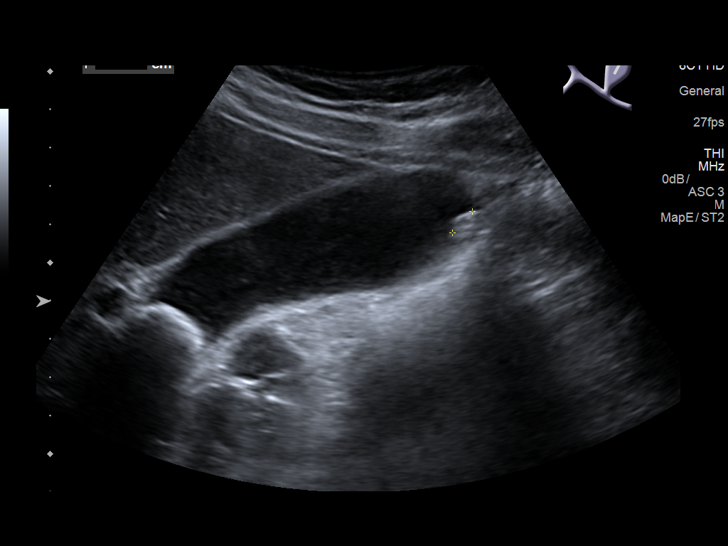
[im 21/34]
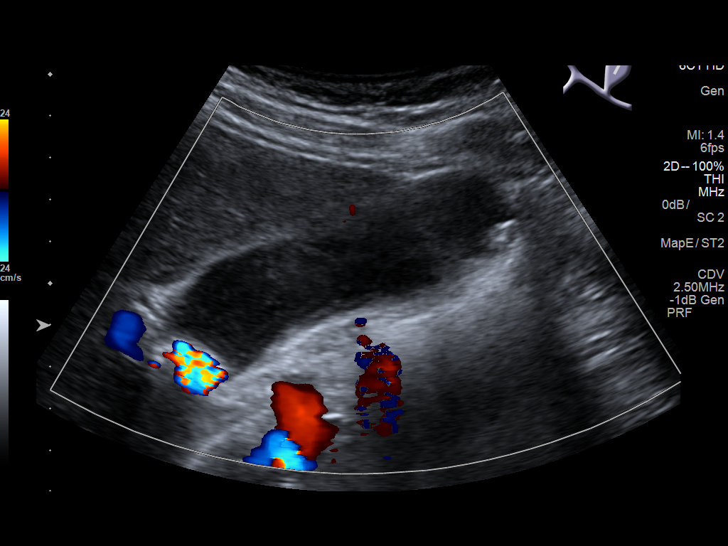
[im 23/34]
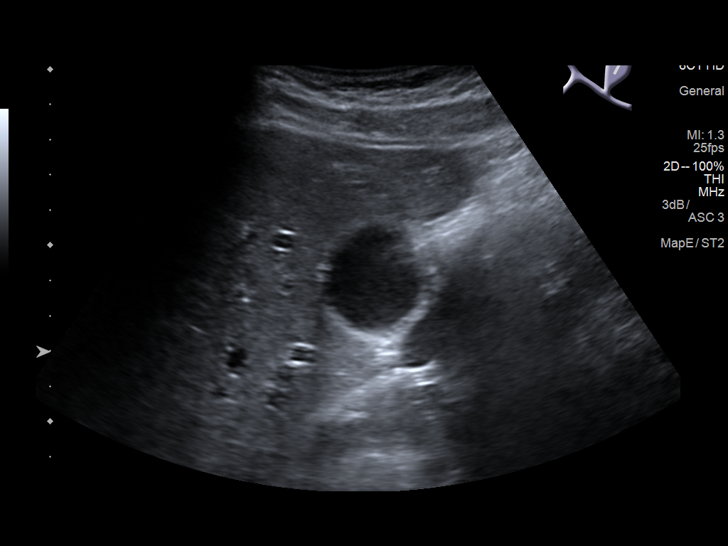
[im 25/34]
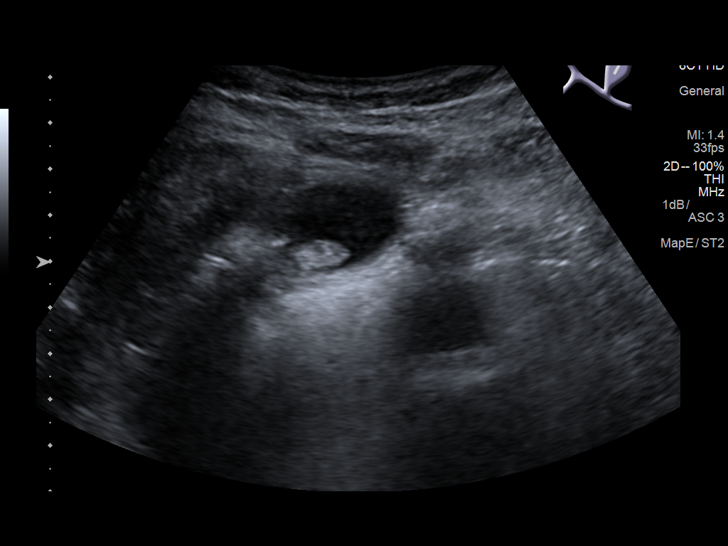
[im 28/34]
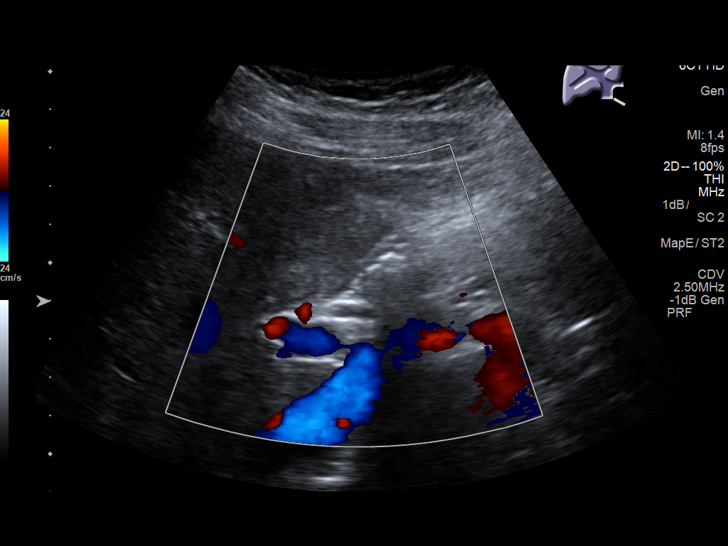
[im 31/34]
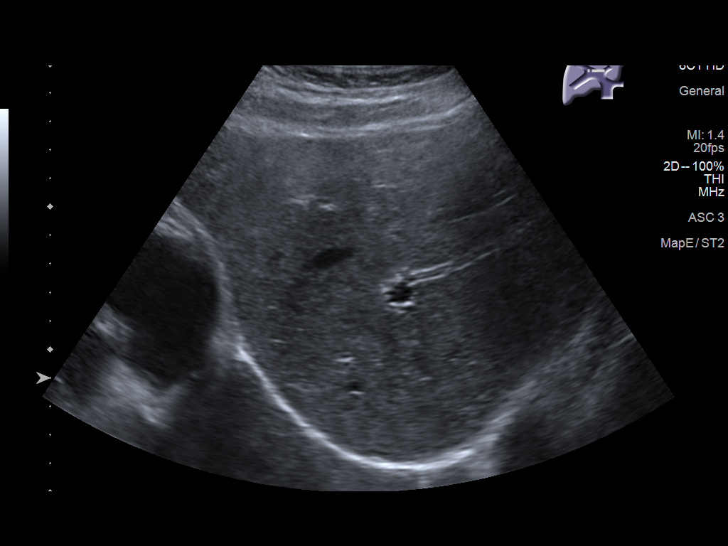
[im 34/34]
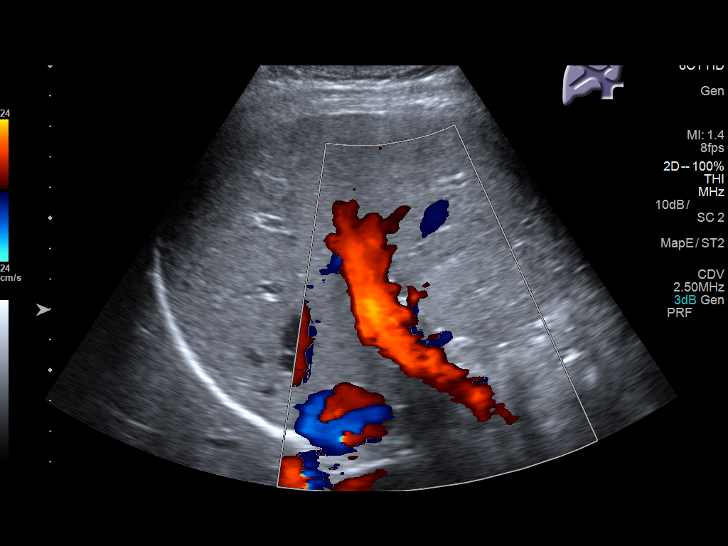

[14 of 25 positions shown; findings below may reference images not displayed]

FINDINGS: Gallbladder:

Dependent calculus in gallbladder 19 mm diameter. Small mild
gallbladder sludge. Question small polyp versus adherent stone at
the gallbladder fundus 8 mm diameter. No gallbladder wall
thickening, pericholecystic fluid or sonographic Murphy sign.

Common bile duct:

Diameter: 2 mm diameter , normal

Liver:

Normal parenchymal echogenicity. No focal mass lesion. Portal vein
is patent on color Doppler imaging with normal direction of blood
flow towards the liver.

No free fluid
IMPRESSION: Cholelithiasis.

Adherent stone versus 8 mm polyp back gallbladder fundus ; yearly
follow-up ultrasound of gallbladder polyps 7-9 mm is recommended to
demonstrate stability and exclude malignancy.

This recommendation follows ACR consensus guidelines: White Paper of
the ACR Incidental Findings Committee II on Gallbladder and Biliary
Findings. [HOSPITAL] 4981:;[DATE].

## 2019-08-26 ENCOUNTER — Ambulatory Visit: Payer: 59 | Attending: Internal Medicine

## 2019-08-26 DIAGNOSIS — Z20822 Contact with and (suspected) exposure to covid-19: Secondary | ICD-10-CM

## 2019-08-27 ENCOUNTER — Encounter: Payer: Self-pay | Admitting: *Deleted

## 2019-08-27 LAB — NOVEL CORONAVIRUS, NAA: SARS-CoV-2, NAA: DETECTED — AB

## 2019-09-07 ENCOUNTER — Ambulatory Visit: Payer: 59 | Attending: Internal Medicine

## 2019-09-07 ENCOUNTER — Other Ambulatory Visit: Payer: Self-pay

## 2019-09-07 DIAGNOSIS — Z20822 Contact with and (suspected) exposure to covid-19: Secondary | ICD-10-CM

## 2019-09-09 LAB — NOVEL CORONAVIRUS, NAA: SARS-CoV-2, NAA: NOT DETECTED

## 2023-12-12 ENCOUNTER — Other Ambulatory Visit (HOSPITAL_COMMUNITY): Payer: Self-pay | Admitting: Family Medicine

## 2023-12-12 DIAGNOSIS — Z1231 Encounter for screening mammogram for malignant neoplasm of breast: Secondary | ICD-10-CM

## 2024-01-20 ENCOUNTER — Ambulatory Visit (HOSPITAL_COMMUNITY)
